# Patient Record
Sex: Male | Born: 1975 | Race: Black or African American | Hispanic: No | Smoking: Former smoker
Health system: Southern US, Community
[De-identification: ages and names within clinical notes are randomized; demographics above are authoritative.]

## PROBLEM LIST (undated history)

## (undated) DIAGNOSIS — R569 Unspecified convulsions: Secondary | ICD-10-CM

## (undated) DIAGNOSIS — F1910 Other psychoactive substance abuse, uncomplicated: Secondary | ICD-10-CM

## (undated) HISTORY — PX: NO PAST SURGERIES: SHX2092

---

## 2017-12-13 ENCOUNTER — Observation Stay (HOSPITAL_COMMUNITY): Payer: BLUE CROSS/BLUE SHIELD

## 2017-12-13 ENCOUNTER — Other Ambulatory Visit: Payer: Self-pay

## 2017-12-13 ENCOUNTER — Encounter (HOSPITAL_COMMUNITY): Payer: Self-pay | Admitting: Emergency Medicine

## 2017-12-13 ENCOUNTER — Emergency Department (HOSPITAL_COMMUNITY): Payer: BLUE CROSS/BLUE SHIELD

## 2017-12-13 ENCOUNTER — Inpatient Hospital Stay (HOSPITAL_COMMUNITY)
Admission: EM | Admit: 2017-12-13 | Discharge: 2017-12-17 | DRG: 918 | Disposition: A | Discharge status: Home / Self Care | Payer: BLUE CROSS/BLUE SHIELD | Attending: Student in an Organized Health Care Education/Training Program | Admitting: Student in an Organized Health Care Education/Training Program

## 2017-12-13 DIAGNOSIS — F1099 Alcohol use, unspecified with unspecified alcohol-induced disorder: Secondary | ICD-10-CM

## 2017-12-13 DIAGNOSIS — T407X1A Poisoning by cannabis (derivatives), accidental (unintentional), initial encounter: Secondary | ICD-10-CM | POA: Diagnosis not present

## 2017-12-13 DIAGNOSIS — Z87891 Personal history of nicotine dependence: Secondary | ICD-10-CM

## 2017-12-13 DIAGNOSIS — F439 Reaction to severe stress, unspecified: Secondary | ICD-10-CM | POA: Diagnosis present

## 2017-12-13 DIAGNOSIS — R7989 Other specified abnormal findings of blood chemistry: Secondary | ICD-10-CM | POA: Diagnosis not present

## 2017-12-13 DIAGNOSIS — R000 Tachycardia, unspecified: Secondary | ICD-10-CM | POA: Diagnosis present

## 2017-12-13 DIAGNOSIS — S01552A Open bite of oral cavity, initial encounter: Secondary | ICD-10-CM | POA: Diagnosis present

## 2017-12-13 DIAGNOSIS — E876 Hypokalemia: Secondary | ICD-10-CM | POA: Diagnosis not present

## 2017-12-13 DIAGNOSIS — F1290 Cannabis use, unspecified, uncomplicated: Secondary | ICD-10-CM | POA: Diagnosis not present

## 2017-12-13 DIAGNOSIS — R569 Unspecified convulsions: Secondary | ICD-10-CM

## 2017-12-13 DIAGNOSIS — R748 Abnormal levels of other serum enzymes: Secondary | ICD-10-CM | POA: Diagnosis present

## 2017-12-13 DIAGNOSIS — X58XXXA Exposure to other specified factors, initial encounter: Secondary | ICD-10-CM | POA: Diagnosis present

## 2017-12-13 DIAGNOSIS — R7309 Other abnormal glucose: Secondary | ICD-10-CM | POA: Diagnosis not present

## 2017-12-13 DIAGNOSIS — M6282 Rhabdomyolysis: Secondary | ICD-10-CM | POA: Diagnosis not present

## 2017-12-13 DIAGNOSIS — S01512A Laceration without foreign body of oral cavity, initial encounter: Secondary | ICD-10-CM | POA: Diagnosis not present

## 2017-12-13 DIAGNOSIS — R739 Hyperglycemia, unspecified: Secondary | ICD-10-CM | POA: Diagnosis present

## 2017-12-13 DIAGNOSIS — N179 Acute kidney failure, unspecified: Secondary | ICD-10-CM | POA: Diagnosis present

## 2017-12-13 HISTORY — DX: Unspecified convulsions: R56.9

## 2017-12-13 HISTORY — DX: Other psychoactive substance abuse, uncomplicated: F19.10

## 2017-12-13 LAB — URINALYSIS, ROUTINE W REFLEX MICROSCOPIC
BILIRUBIN URINE: NEGATIVE
Bacteria, UA: NONE SEEN
Glucose, UA: 150 mg/dL — AB
Ketones, ur: NEGATIVE mg/dL
Leukocytes, UA: NEGATIVE
Nitrite: NEGATIVE
PROTEIN: 30 mg/dL — AB
SQUAMOUS EPITHELIAL / LPF: NONE SEEN
Specific Gravity, Urine: 1.011 (ref 1.005–1.030)
pH: 6 (ref 5.0–8.0)

## 2017-12-13 LAB — COMPREHENSIVE METABOLIC PANEL
ALBUMIN: 4.2 g/dL (ref 3.5–5.0)
ALT: 32 U/L (ref 17–63)
ANION GAP: 16 — AB (ref 5–15)
AST: 51 U/L — AB (ref 15–41)
Alkaline Phosphatase: 76 U/L (ref 38–126)
BILIRUBIN TOTAL: 1 mg/dL (ref 0.3–1.2)
BUN: 8 mg/dL (ref 6–20)
CHLORIDE: 100 mmol/L — AB (ref 101–111)
CO2: 22 mmol/L (ref 22–32)
Calcium: 9.4 mg/dL (ref 8.9–10.3)
Creatinine, Ser: 1.3 mg/dL — ABNORMAL HIGH (ref 0.61–1.24)
GFR calc Af Amer: >60 mL/min (ref >60)
GFR calc non Af Amer: >60 mL/min (ref >60)
GLUCOSE: 214 mg/dL — AB (ref 65–99)
POTASSIUM: 2.7 mmol/L — AB (ref 3.5–5.1)
SODIUM: 138 mmol/L (ref 135–145)
TOTAL PROTEIN: 6.7 g/dL (ref 6.5–8.1)

## 2017-12-13 LAB — CBG MONITORING, ED: GLUCOSE-CAPILLARY: 122 mg/dL — AB (ref 65–99)

## 2017-12-13 LAB — CBC WITH DIFFERENTIAL/PLATELET
BASOS PCT: 0 %
Basophils Absolute: 0 10*3/uL (ref 0.0–0.1)
Eosinophils Absolute: 0 10*3/uL (ref 0.0–0.7)
Eosinophils Relative: 0 %
HEMATOCRIT: 40.1 % (ref 39.0–52.0)
Hemoglobin: 13.4 g/dL (ref 13.0–17.0)
LYMPHS PCT: 20 %
Lymphs Abs: 1.8 10*3/uL (ref 0.7–4.0)
MCH: 30.7 pg (ref 26.0–34.0)
MCHC: 33.4 g/dL (ref 30.0–36.0)
MCV: 92 fL (ref 78.0–100.0)
MONO ABS: 0.6 10*3/uL (ref 0.1–1.0)
MONOS PCT: 7 %
NEUTROS ABS: 6.7 10*3/uL (ref 1.7–7.7)
Neutrophils Relative %: 73 %
Platelets: 305 10*3/uL (ref 150–400)
RBC: 4.36 MIL/uL (ref 4.22–5.81)
RDW: 13.3 % (ref 11.5–15.5)
WBC: 9.2 10*3/uL (ref 4.0–10.5)

## 2017-12-13 LAB — RAPID URINE DRUG SCREEN, HOSP PERFORMED
Amphetamines: NOT DETECTED
BARBITURATES: NOT DETECTED
BENZODIAZEPINES: NOT DETECTED
COCAINE: NOT DETECTED
Opiates: NOT DETECTED
TETRAHYDROCANNABINOL: POSITIVE — AB

## 2017-12-13 LAB — BASIC METABOLIC PANEL
Anion gap: 9 (ref 5–15)
BUN: 6 mg/dL (ref 6–20)
CALCIUM: 8.8 mg/dL — AB (ref 8.9–10.3)
CO2: 22 mmol/L (ref 22–32)
CREATININE: 1.15 mg/dL (ref 0.61–1.24)
Chloride: 109 mmol/L (ref 101–111)
GFR calc Af Amer: >60 mL/min (ref >60)
GFR calc non Af Amer: >60 mL/min (ref >60)
Glucose, Bld: 89 mg/dL (ref 65–99)
Potassium: 3.9 mmol/L (ref 3.5–5.1)
Sodium: 140 mmol/L (ref 135–145)

## 2017-12-13 LAB — ETHANOL: Alcohol, Ethyl (B): <10 mg/dL (ref <10)

## 2017-12-13 LAB — MAGNESIUM: MAGNESIUM: 2.3 mg/dL (ref 1.7–2.4)

## 2017-12-13 LAB — I-STAT VENOUS BLOOD GAS, ED
Bicarbonate: 25.1 mmol/L (ref 20.0–28.0)
O2 Saturation: 69 %
TCO2: 26 mmol/L (ref 22–32)
pCO2, Ven: 42.6 mmHg — ABNORMAL LOW (ref 44.0–60.0)
pH, Ven: 7.378 (ref 7.250–7.430)
pO2, Ven: 37 mmHg (ref 32.0–45.0)

## 2017-12-13 LAB — CK
CK TOTAL: 11261 U/L — AB (ref 49–397)
CK TOTAL: 23980 U/L — AB (ref 49–397)

## 2017-12-13 LAB — I-STAT TROPONIN, ED: Troponin i, poc: 0 ng/mL (ref 0.00–0.08)

## 2017-12-13 MED ORDER — SODIUM CHLORIDE 0.9 % IV SOLN
INTRAVENOUS | Status: AC
  Administered 2017-12-13 – 2017-12-14 (×6): via INTRAVENOUS

## 2017-12-13 MED ORDER — SODIUM CHLORIDE 0.9% FLUSH
3.0000 mL | Freq: Two times a day (BID) | INTRAVENOUS | Status: DC
  Administered 2017-12-14: 3 mL via INTRAVENOUS

## 2017-12-13 MED ORDER — SODIUM CHLORIDE 0.9% FLUSH: 3.0000 mL | Freq: Two times a day (BID) | INTRAVENOUS | Status: DC

## 2017-12-13 MED ORDER — POTASSIUM CHLORIDE 10 MEQ/100ML IV SOLN
10.0000 meq | Freq: Once | INTRAVENOUS | Status: AC
  Administered 2017-12-13: 10 meq via INTRAVENOUS
  Filled 2017-12-13: qty 100

## 2017-12-13 MED ORDER — SODIUM CHLORIDE 0.9 % IV BOLUS (SEPSIS)
1000.0000 mL | Freq: Once | INTRAVENOUS | Status: AC
  Administered 2017-12-13: 1000 mL via INTRAVENOUS

## 2017-12-13 MED ORDER — CHLORDIAZEPOXIDE HCL 5 MG PO CAPS: 10.0000 mg | ORAL_CAPSULE | Freq: Three times a day (TID) | ORAL | Status: DC

## 2017-12-13 MED ORDER — SODIUM CHLORIDE 0.9% FLUSH: 3.0000 mL | INTRAVENOUS | Status: DC | PRN

## 2017-12-13 MED ORDER — ENOXAPARIN SODIUM 40 MG/0.4ML ~~LOC~~ SOLN
40.0000 mg | SUBCUTANEOUS | Status: DC
  Administered 2017-12-13 – 2017-12-16 (×4): 40 mg via SUBCUTANEOUS
  Filled 2017-12-13 (×4): qty 0.4

## 2017-12-13 MED ORDER — SODIUM CHLORIDE 0.9 % IV SOLN
1000.0000 mg | Freq: Once | INTRAVENOUS | Status: AC
  Administered 2017-12-13: 1000 mg via INTRAVENOUS
  Filled 2017-12-13: qty 10

## 2017-12-13 MED ORDER — SODIUM CHLORIDE 0.9 % IV SOLN
500.0000 mg | Freq: Two times a day (BID) | INTRAVENOUS | Status: DC
  Administered 2017-12-13 – 2017-12-14 (×2): 500 mg via INTRAVENOUS
  Filled 2017-12-13 (×2): qty 5

## 2017-12-13 MED ORDER — POTASSIUM CHLORIDE CRYS ER 20 MEQ PO TBCR
40.0000 meq | EXTENDED_RELEASE_TABLET | Freq: Once | ORAL | Status: AC
  Administered 2017-12-13: 40 meq via ORAL
  Filled 2017-12-13: qty 2

## 2017-12-13 MED ORDER — LORAZEPAM 2 MG/ML IJ SOLN
1.0000 mg | Freq: Once | INTRAMUSCULAR | Status: DC | PRN
  Filled 2017-12-13: qty 1

## 2017-12-13 MED ORDER — LACTATED RINGERS IV BOLUS (SEPSIS)
1000.0000 mL | Freq: Once | INTRAVENOUS | Status: AC
  Administered 2017-12-13: 1000 mL via INTRAVENOUS

## 2017-12-13 MED ORDER — LORAZEPAM 2 MG/ML IJ SOLN
1.0000 mg | Freq: Once | INTRAMUSCULAR | Status: AC
  Administered 2017-12-13: 1 mg via INTRAVENOUS
  Filled 2017-12-13: qty 1

## 2017-12-13 MED ORDER — ONDANSETRON HCL 4 MG/2ML IJ SOLN
4.0000 mg | Freq: Four times a day (QID) | INTRAMUSCULAR | Status: DC | PRN
  Administered 2017-12-13: 4 mg via INTRAVENOUS
  Filled 2017-12-13: qty 2

## 2017-12-13 MED ORDER — SODIUM CHLORIDE 0.9 % IV SOLN
250.0000 mL | INTRAVENOUS | Status: DC | PRN
  Administered 2017-12-14: 250 mL via INTRAVENOUS

## 2017-12-13 NOTE — ED Notes (Signed)
Patient remains in MRI. Neurology paged to request ativan

## 2017-12-13 NOTE — ED Provider Notes (Signed)
[facility]   CSN: [id number] Arrival date & time: 12/13/17  0449     History   Chief Complaint Chief Complaint  Patient presents with  . Seizures    HPI Lady SaucierQuincy Beza is a 42 y.o. male.  HPI 42 year old African-American male with no pertinent past medical history presents to the emergency department today with family at bedside for evaluation following a seizure-like activity.  The patient states that he was getting home from work this morning after working overnight and his family heard him in the kitchen.  Patient states that this is the last thing that he remembers.  Family reports that they got up and went to the kitchen as outpatient "convulsing on the floor".  They state that he was convulsing for approximately 15-20 minutes.  They state that he was groggy and altered afterwards.  EMS arrived where they evaluated patient and convince patient to come to the ED for evaluation.  Patient was postictal at EMS arrival at home.  The patient states that he thinks that he might of had one seizure approximately 20 years ago but has no other history of seizure take no seizure medication does not have a neurologist.  Patient has no medical complaints and is not taking medications regularly.  Patient denies any associated symptoms including headache, vision changes, lightheadedness, dizziness, neck pain, fevers, chills, chest pain, shortness of breath, abdominal pain, nausea, emesis, urinary symptoms, recent illnesses or surgeries.  Pt denies any fever, chill, ha, vision changes, lightheadedness, dizziness, congestion, neck pain, cp, sob, cough, abd pain, n/v/d, urinary symptoms, change in bowel habits, melena, hematochezia, lower extremity paresthesias.  History reviewed. No pertinent past medical history.  There are no active problems to display for this patient.   History reviewed. No pertinent surgical history.     Home  Medications    Prior to Admission medications   Not on File    Family History No family history on file.  Social History Social History   Tobacco Use  . Smoking status: Never Smoker  . Smokeless tobacco: Never Used  Substance Use Topics  . Alcohol use: Yes  . Drug use: Yes    Types: Marijuana     Allergies   Patient has no known allergies.   Review of Systems Review of Systems  Constitutional: Negative for chills and fever.  HENT: Negative for congestion and sore throat.   Eyes: Negative for visual disturbance.  Respiratory: Negative for cough and shortness of breath.   Cardiovascular: Negative for chest pain.  Gastrointestinal: Negative for abdominal pain, diarrhea, nausea and vomiting.  Genitourinary: Negative for dysuria, flank pain, frequency, hematuria, scrotal swelling, testicular pain and urgency.  Musculoskeletal: Negative for arthralgias, myalgias, neck pain and neck stiffness.  Skin: Negative for rash.  Neurological: Positive for seizures. Negative for dizziness, syncope, weakness, light-headedness, numbness and headaches.  Psychiatric/Behavioral: Negative for sleep disturbance. The patient is not nervous/anxious.      Physical Exam Updated Vital Signs BP (!) 155/92   Pulse 89   Temp 97.8 F (36.6 C) (Oral)   Resp (!) 22   Ht 6\' 2"  (1.88 m)   Wt 79.4 kg (175 lb)   SpO2 100%   BMI 22.47 kg/m   Physical Exam  Constitutional: He is oriented to person, place, and time. He appears well-developed and well-nourished.  Non-toxic appearance. No distress.  HENT:  Head: Normocephalic and atraumatic.  Mouth/Throat: Oropharynx is clear and moist.  No bilateral hemotympanum.  No septal hematoma. Patient does have small abrasion to the right side of his tongue where he did bite his tongue during his seizure-like activity.  Eyes: Conjunctivae and EOM are normal. Pupils are equal, round, and reactive to light. Right eye exhibits no discharge. Left eye exhibits  no discharge.  Neck: Normal range of motion. Neck supple.  No c spine midline tenderness. No paraspinal tenderness. No deformities or step offs noted. Full ROM. Supple. No nuchal rigidity.    Cardiovascular: Normal rate, regular rhythm, normal heart sounds and intact distal pulses. Exam reveals no gallop and no friction rub.  No murmur heard. Pulmonary/Chest: Effort normal and breath sounds normal. No stridor. No respiratory distress. He has no wheezes. He has no rales. He exhibits no tenderness.  Abdominal: Soft. Bowel sounds are normal. He exhibits no distension. There is no tenderness. There is no rebound and no guarding.  Musculoskeletal: Normal range of motion. He exhibits no tenderness.  No midline T spine or L spine tenderness. No deformities or step offs noted. Full ROM. Pelvis is stable.   Lymphadenopathy:    He has no cervical adenopathy.  Neurological: He is alert and oriented to person, place, and time.  The patient is alert, attentive, and oriented x 3. Speech is clear. Cranial nerve II-VII grossly intact. Negative pronator drift. Sensation intact. Strength 5/5 in all extremities. Reflexes 2+ and symmetric at biceps, triceps, knees, and ankles. Rapid alternating movement and fine finger movements intact.  Romberg and gait not assessed due to seizure protocol in place.   Skin: Skin is warm and dry. Capillary refill takes less than 2 seconds. No rash noted.  Psychiatric: His behavior is normal. Judgment and thought content normal.  Nursing note and vitals reviewed.    ED Treatments / Results  Labs (all labs ordered are listed, but only abnormal results are displayed) Labs Reviewed  COMPREHENSIVE METABOLIC PANEL - Abnormal; Notable for the following components:      Result Value   Potassium 2.7 (*)    Chloride 100 (*)    Glucose, Bld 214 (*)    Creatinine, Ser 1.30 (*)    AST 51 (*)    Anion gap 16 (*)    All other components within normal limits  ETHANOL  CBC WITH  DIFFERENTIAL/PLATELET  MAGNESIUM  RAPID URINE DRUG SCREEN, HOSP PERFORMED  URINALYSIS, ROUTINE W REFLEX MICROSCOPIC  I-STAT TROPONIN, ED    EKG  EKG Interpretation  Date/Time:  Tuesday December 13 2017 05:10:53 EST Ventricular Rate:  103 PR Interval:    QRS Duration: 93 QT Interval:  347 QTC Calculation: 455 R Axis:   109 Text Interpretation:  Sinus tachycardia Consider right atrial enlargement Right ventricular hypertrophy ST elevation suggests acute pericarditis No old tracing to compare Confirmed by Devoria Albe ([id number]) on 12/13/2017 5:12:45 AM       Radiology Ct Head Wo Contrast  Result Date: 12/13/2017 CLINICAL DATA:  Seizure EXAM: CT HEAD WITHOUT CONTRAST TECHNIQUE: Contiguous axial images were obtained from the base of the skull through the vertex without intravenous contrast. COMPARISON:  None. FINDINGS: Brain: No mass lesion, intraparenchymal hemorrhage or extra-axial collection. No evidence of acute cortical infarct. Brain parenchyma and CSF-containing spaces are normal for age. Vascular: No hyperdense vessel or unexpected calcification. Skull: Normal visualized skull base, calvarium and extracranial soft tissues. Sinuses/Orbits: No sinus fluid levels or advanced mucosal thickening. No mastoid effusion. Normal orbits. IMPRESSION: Normal head CT. Electronically Signed   By: Deatra Robinson M.D.   On:  12/13/2017 06:13    Procedures Procedures (including critical care time)  Medications Ordered in ED Medications  lactated ringers bolus 1,000 mL (1,000 mLs Intravenous New Bag/Given 12/13/17 0704)  potassium chloride SA (K-DUR,KLOR-CON) CR tablet 40 mEq (not administered)  potassium chloride 10 mEq in 100 mL IVPB (10 mEq Intravenous New Bag/Given 12/13/17 0709)     Initial Impression / Assessment and Plan / ED Course  I have reviewed the triage vital signs and the nursing notes.  Pertinent labs & imaging results that were available during my care of the patient were reviewed by  me and considered in my medical decision making (see chart for details).  Clinical Course as of Dec 14 1123  Tue Dec 13, 2017  2440 At 850 was notified by the nursing staff that patient had an additional seizure in the room.  He was incontinent of urine.  Presumably patient did bite his tongue because he was bleeding from his mouth however have not been able to evaluate given patient's postictal state and not being very cooperative today.  Patient's vital signs are reassuring at this time.  He is diaphoretic in the room.  Keppra load was ordered.  Spoke with Dr. Elon Spanner with neurology who recommends holding Ativan will see patient in consultation with hospital admission.  Patient will need MRI, EEG and seizure workup.  [KL]    Clinical Course User Index [KL] Rise Mu, PA-C   Patient presents to the ED for evaluation of seizure-like activity at home.  Patient has had one prior seizure 20 years ago due to drugs however no history of seizures and does not see neurology regularly.  Patient takes no medications regularly.  Exam patient is overall well-appearing and postictal at this time.  He is conscious alert and oriented x3 and can follows commands appropriately.  No signs of intracranial, intrathoracic, intra-abdominal trauma.  Patient is afebrile.  Heart regular rate and rhythm.  Lungs clear to auscultation bilaterally.  No focal abdominal tenderness.  No nuchal rigidity is noted.  No focal neuro deficit noted on exam.  Initial lab work and imaging was started in triage.  Patient had normal CT scan.  Lab work does reveal hypokalemia of 2.7 which was replaced orally with IV potassium.  No leukocytosis was noted.  The patient's magnesium and calcium levels are unremarkable.  Normal sodium.  Patient's creatinine is mildly elevated at 1.3 baseline appears to be 1-1.1.  He does have a mild anion gap of 16.  Mild elevation in AST of 51 with no history of same.  UA does not show any signs of  infection.  No ketones noted in the urine.  Patient's pH is normal.  Bicarb is normal.  Ethanol less than 10.  Troponin was negative.  UDS does show positive marijuana.  CK level was drawn that was 11,000.  Patient appears back at baseline per his family.  Given that this was patient's first seizure likely will need neurology outpatient follow-up.  After patient was observed for approximately 4 hours the nurse notified me that patient had [place] additional seizure.  On examination patient was noted to be incontinent.  He was bleeding from his mouth and noted to have a laceration to the left side of his tongue. This was not open enough to warrant repair. Patient was postictal at this time.  Vital signs are reassuring.  Patient was diaphoretic.  Iv keppra was ordered. Hold on the Ativan given that pt was postictal.  Spoke with Dr.  Aroora with neurology who recommended hospital admission and will order patient MRI, EEG.  Head no no other additional issues at this time.  I did speak with internal medicine teaching service who agrees to admission will see patient in the ED and place admission orders.  I did reevaluate patient who was more alert and oriented.  Follows commands appropriately.  Family was updated on plan of care.  Patient verbalized understanding with plan.  Remains hemodynamically stable this time.  Patient was being seen by neurology.  Awaiting room assignment and orders at this time.  Final Clinical Impressions(s) / ED Diagnoses   Final diagnoses:  None    ED Discharge Orders    None       Wallace Keller 12/13/17 1608    Devoria Albe, MD 12/14/17 [phone]

## 2017-12-13 NOTE — Progress Notes (Signed)
EEG completed, results pending. 

## 2017-12-13 NOTE — ED Notes (Signed)
Patient transported to MRI 

## 2017-12-13 NOTE — ED Notes (Addendum)
Patient placed on a monitor/pulse oximetry , saline lock intact , side rails padded , alert and oriented , respirations unlabored /denies pain .

## 2017-12-13 NOTE — ED Triage Notes (Signed)
Patient arrived with EMS from home , found on the kitchen floor seizing this morning approx. 5 mins , post ictal at EMS arrival at home , pt. can not recall incident , alert and oriented at arrival , denies pain/respirations unlabored , CBG=173 by EMS .

## 2017-12-13 NOTE — Consult Note (Signed)
NEURO HOSPITALIST CONSULT NOTE   Requestig physician: Dr. Lynelle Doctor  Reason for Consult: Seizure  History obtained from:  Patient     HPI:                                                                                                                                          Reginald Ray is an 42 y.o. male no significant past medical history other than a seizure back in 1997.  Talking to the mother she states that she believes it was due to substance abuse.  Patient works at [employer] and apparently today had a seizure.  He was not driving but feeling packages.  Patient was brought to the emergency department where he had a second seizure to which he bit his tongue and has a large laceration on the right side of his tongue.  Currently he is slightly postictal but able to give a good history.  Mother walked into the room and stated he was a normal birth, no head trauma in the past, no febrile seizures in the past, no seizure history in the family, and no sickle cell trait.  His.  He does admit to smoking marijuana last night.  States he does use marijuana from a Herbalist.  States he drinks 1 beer a night.  He is being loaded with 1 g of Keppra.  Past Medical History:  Diagnosis Date  . Seizure (HCC)   . Substance abuse (HCC)     History reviewed. No pertinent surgical history.  Family History  Problem Relation Age of Onset  . Hypertension Mother   . Hypertension Father    Social History:  reports that  has never smoked. he has never used smokeless tobacco. He reports that he drinks about 0.6 oz of alcohol per week. He reports that he uses drugs. Drug: Marijuana.  No Known Allergies  MEDICATIONS:                                                                                                                     Current Facility-Administered Medications  Medication Dose Route Frequency Provider Last Rate Last Dose  . LORazepam (ATIVAN) injection 1 mg  1 mg Intravenous  Once PRN Jacalyn Lefevre, MD       Current Outpatient  Medications  Medication Sig Dispense Refill  . acetaminophen (TYLENOL) 500 MG tablet Take 1,000 mg by mouth every 6 (six) hours as needed for mild pain.     ROS:                                                                                                                                       History obtained from the patient  General ROS: negative for - chills, fatigue, fever, night sweats, weight gain or weight loss Psychological ROS: negative for - behavioral disorder, hallucinations, memory difficulties, mood swings or suicidal ideation Ophthalmic ROS: negative for - blurry vision, double vision, eye pain or loss of vision ENT ROS: negative for - epistaxis, nasal discharge, oral lesions, sore throat, tinnitus or vertigo Allergy and Immunology ROS: negative for - hives or itchy/watery eyes Hematological and Lymphatic ROS: negative for - bleeding problems, bruising or swollen lymph nodes Endocrine ROS: negative for - galactorrhea, hair pattern changes, polydipsia/polyuria or temperature intolerance Respiratory ROS: negative for - cough, hemoptysis, shortness of breath or wheezing Cardiovascular ROS: negative for - chest pain, dyspnea on exertion, edema or irregular heartbeat Gastrointestinal ROS: negative for - abdominal pain, diarrhea, hematemesis, nausea/vomiting or stool incontinence Genito-Urinary ROS: negative for - dysuria, hematuria, incontinence or urinary frequency/urgency Musculoskeletal ROS: negative for - joint swelling or muscular weakness Neurological ROS: as noted in HPI Dermatological ROS: negative for rash and skin lesion changes   Blood pressure (!) 142/78, pulse 93, temperature 98.5 F (36.9 C), temperature source Oral, resp. rate 17, height 6\' 2"  (1.88 m), weight 79.4 kg (175 lb), SpO2 100 %.  General Examination:                                                                                                        Physical Exam  HEENT-  Normocephalic, no lesions, without obvious abnormality.  Normal external eye and conjunctiva.  Laceration to the right side of his tongue Cardiovascular- S1-S2 audible, pulses palpable throughout   Lungs-no rhonchi or wheezing noted, no excessive working breathing.  Saturations within normal limits Abdomen- All 4 quadrants palpated and nontender Extremities- Warm, dry and intact Musculoskeletal-no joint tenderness, deformity or swelling Skin-warm and dry, no hyperpigmentation, vitiligo, or suspicious lesions  Neurological Examination Mental Status: Alert, oriented, thought content appropriate.  Speech fluent without evidence of aphasia.  Able to follow 3 step commands without difficulty. Cranial Nerves: II: Visual fields grossly normal,  III,IV, VI: ptosis not present, extra-ocular motions intact  bilaterally pupils equal, round, reactive to light and accommodation V,VII: smile symmetric, facial light touch sensation normal bilaterally VIII: hearing normal bilaterally IX,X: uvula rises symmetrically XI: bilateral shoulder shrug XII: midline tongue extension Motor: Right : Upper extremity   5/5    Left:     Upper extremity   5/5  Lower extremity   5/5     Lower extremity   5/5 Tone and bulk:normal tone throughout; no atrophy noted Sensory: Pinprick and light touch intact throughout, bilaterally Deep Tendon Reflexes: 2+ and symmetric throughout Plantars: Right: downgoing   Left: downgoing Cerebellar: normal finger-to-nose,  and normal heel-to-shin test    Lab Results: Basic Metabolic Panel: Recent Labs  Lab 12/13/17 0530  NA 138  K 2.7*  CL 100*  CO2 22  GLUCOSE 214*  BUN 8  CREATININE 1.30*  CALCIUM 9.4  MG 2.3    Liver Function Tests: Recent Labs  Lab 12/13/17 0530  AST 51*  ALT 32  ALKPHOS 76  BILITOT 1.0  PROT 6.7  ALBUMIN 4.2   No results for input(s): LIPASE, AMYLASE in the last 168 hours. No results for input(s): AMMONIA in  the last 168 hours.  CBC: Recent Labs  Lab 12/13/17 0530  WBC 9.2  NEUTROABS 6.7  HGB 13.4  HCT 40.1  MCV 92.0  PLT 305    Cardiac Enzymes: Recent Labs  Lab 12/13/17 0808  CKTOTAL 11,261*    Lipid Panel: No results for input(s): CHOL, TRIG, HDL, CHOLHDL, VLDL, LDLCALC in the last 168 hours.  CBG: Recent Labs  Lab 12/13/17 0901  GLUCAP 122*    Microbiology: No results found for this or any previous visit.  Coagulation Studies: No results for input(s): LABPROT, INR in the last 72 hours.  Imaging: Ct Head Wo Contrast  Result Date: 12/13/2017 CLINICAL DATA:  Seizure EXAM: CT HEAD WITHOUT CONTRAST TECHNIQUE: Contiguous axial images were obtained from the base of the skull through the vertex without intravenous contrast. COMPARISON:  None. FINDINGS: Brain: No mass lesion, intraparenchymal hemorrhage or extra-axial collection. No evidence of acute cortical infarct. Brain parenchyma and CSF-containing spaces are normal for age. Vascular: No hyperdense vessel or unexpected calcification. Skull: Normal visualized skull base, calvarium and extracranial soft tissues. Sinuses/Orbits: No sinus fluid levels or advanced mucosal thickening. No mastoid effusion. Normal orbits. IMPRESSION: Normal head CT. Electronically Signed   By: Deatra Robinson M.D.   On: 12/13/2017 06:13   Assessment and plan per attending neurologist  Felicie Morn PA-C [facility] [phone]  12/13/2017, 9:38 AM   Assessment/Plan: 42 year old male with known marijuana use presenting with breakthrough seizure x2.  Currently he is slightly postictal.  He is receiving a 1 g of Keppra x1 now and will receive 500 mg twice daily. Needs further imaging to r/o structural lesion. CTH reviewed by me and is unremarkable for acute change or bleed.   Impression --Seizures - h/o one seizure in 1997 and today had 2 seizures with complete return to baseline between them. No suspicion for status epilepticus based  on exam. --Marijuana use  Recommend: -Keppra 500 mg twice daily -Seizure precautions -routine EEG -MRI with and without contrast of brain -Will need to follow-up with neurologist as an outpatient  -discussed importance of abstaining from illicit drugs  Per [facility] statutes, patients with seizures are not allowed to drive until they have been seizure-free for six months.   Use caution when using heavy equipment or power tools. Avoid working on ladders or at heights. Take  showers instead of baths. Ensure the water temperature is not too high on the home water heater. Do not go swimming alone. Do not lock yourself in a room alone (i.e. bathroom). When caring for infants or small children, sit down when holding, feeding, or changing them to minimize risk of injury to the child in the event you have a seizure. Maintain good sleep hygiene. Avoid alcohol.   If patient has another seizure, call 911 and bring them back to the ED if: A. The seizure lasts longer than 5 minutes.  B. The patient doesn't wake shortly after the seizure or has new problems such as difficulty seeing, speaking or moving following the seizure C. The patient was injured during the seizure D. The patient has a temperature over 102 F (39C) E. The patient vomited during the seizure and now is having trouble breathing   Attending Neurohospitalist Addendum Patient seen and examined with APP/Resident. Agree with the history and physical as documented above. Agree with the plan as documented, which I helped formulate. I have independently reviewed the chart, obtained history, review of systems and examined the patient.I have personally reviewed pertinent head/neck/spine imaging (CT/MRI). Please feel free to call with any questions.  I have discussed seizure precautions and driving restriction indetail with patient and his mother who was at bedside. --- Milon DikesAshish Johnette Teigen, MD [facility] Pager:  [phone]  If 7pm to 7am, please call on call as listed on AMION.

## 2017-12-13 NOTE — ED Notes (Signed)
Patient's monitor alarming. When checking on patient, patient found to be covered in blood confused, incontinent and not following commands. EDP aware and at bedside. Will continue to monitor

## 2017-12-13 NOTE — ED Notes (Signed)
Patient transported to CT scan . 

## 2017-12-13 NOTE — ED Notes (Signed)
Neuro at bedside.

## 2017-12-13 NOTE — Procedures (Signed)
HPI:  42 y/o with seizure  TECHNICAL SUMMARY:  A multichannel referential and bipolar montage EEG using the standard international 10-20 system was performed on the patient described as awake and drowsy.  The dominant background activity consists of 10-11 hertz activity seen most prominantly over the posterior head region.  The backgound activity is reactive to eye opening and closing procedures.  Low voltage fast (beta) activity is distributed symmetrically and maximally over the anterior head regions.  ACTIVATION:  Stepwise photic stimulation and HV were not performed  EPILEPTIFORM ACTIVITY:  There were no spikes, sharp waves or paroxysmal activity.  SLEEP: Stage I and a very brief stage II sleep architecture were noted.   IMPRESSION:  This is a normal EEG for the patients stated age.  There were no focal, hemispheric or lateralizing features.  No epileptiform activity was recorded.  A normal EEG does not exclude the diagnosis of a seizure disorder and if seizure remains high on the list of differential diagnosis, an ambulatory EEG may be of value.  Clinical correlation is required.

## 2017-12-13 NOTE — ED Notes (Signed)
Urinal given to patient.

## 2017-12-13 NOTE — ED Notes (Signed)
Pharmacy notified that keppra is needed stat
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
## 2017-12-14 DIAGNOSIS — F1290 Cannabis use, unspecified, uncomplicated: Secondary | ICD-10-CM

## 2017-12-14 DIAGNOSIS — R569 Unspecified convulsions: Secondary | ICD-10-CM | POA: Diagnosis present

## 2017-12-14 DIAGNOSIS — S01512D Laceration without foreign body of oral cavity, subsequent encounter: Secondary | ICD-10-CM | POA: Diagnosis not present

## 2017-12-14 DIAGNOSIS — X58XXXD Exposure to other specified factors, subsequent encounter: Secondary | ICD-10-CM

## 2017-12-14 DIAGNOSIS — R7309 Other abnormal glucose: Secondary | ICD-10-CM

## 2017-12-14 DIAGNOSIS — M6282 Rhabdomyolysis: Secondary | ICD-10-CM

## 2017-12-14 DIAGNOSIS — R7989 Other specified abnormal findings of blood chemistry: Secondary | ICD-10-CM | POA: Diagnosis not present

## 2017-12-14 DIAGNOSIS — R000 Tachycardia, unspecified: Secondary | ICD-10-CM | POA: Diagnosis present

## 2017-12-14 DIAGNOSIS — T407X1A Poisoning by cannabis (derivatives), accidental (unintentional), initial encounter: Secondary | ICD-10-CM | POA: Diagnosis present

## 2017-12-14 DIAGNOSIS — N179 Acute kidney failure, unspecified: Secondary | ICD-10-CM | POA: Diagnosis present

## 2017-12-14 DIAGNOSIS — R748 Abnormal levels of other serum enzymes: Secondary | ICD-10-CM | POA: Diagnosis present

## 2017-12-14 DIAGNOSIS — E876 Hypokalemia: Secondary | ICD-10-CM

## 2017-12-14 DIAGNOSIS — X58XXXA Exposure to other specified factors, initial encounter: Secondary | ICD-10-CM | POA: Diagnosis present

## 2017-12-14 DIAGNOSIS — Z87891 Personal history of nicotine dependence: Secondary | ICD-10-CM | POA: Diagnosis not present

## 2017-12-14 DIAGNOSIS — S01552A Open bite of oral cavity, initial encounter: Secondary | ICD-10-CM | POA: Diagnosis present

## 2017-12-14 DIAGNOSIS — F439 Reaction to severe stress, unspecified: Secondary | ICD-10-CM | POA: Diagnosis present

## 2017-12-14 DIAGNOSIS — R739 Hyperglycemia, unspecified: Secondary | ICD-10-CM | POA: Diagnosis present

## 2017-12-14 LAB — BASIC METABOLIC PANEL
ANION GAP: 13 (ref 5–15)
BUN: 5 mg/dL — AB (ref 6–20)
CHLORIDE: 110 mmol/L (ref 101–111)
CO2: 17 mmol/L — ABNORMAL LOW (ref 22–32)
Calcium: 8.4 mg/dL — ABNORMAL LOW (ref 8.9–10.3)
Creatinine, Ser: 1.14 mg/dL (ref 0.61–1.24)
GFR calc Af Amer: >60 mL/min (ref >60)
GFR calc non Af Amer: >60 mL/min (ref >60)
GLUCOSE: 79 mg/dL (ref 65–99)
POTASSIUM: 3.5 mmol/L (ref 3.5–5.1)
Sodium: 140 mmol/L (ref 135–145)

## 2017-12-14 LAB — HIV ANTIBODY (ROUTINE TESTING W REFLEX): HIV Screen 4th Generation wRfx: NONREACTIVE

## 2017-12-14 LAB — PHOSPHORUS: Phosphorus: 2.3 mg/dL — ABNORMAL LOW (ref 2.5–4.6)

## 2017-12-14 LAB — MAGNESIUM: Magnesium: 1.8 mg/dL (ref 1.7–2.4)

## 2017-12-14 LAB — CK: Total CK: 24920 U/L — ABNORMAL HIGH (ref 49–397)

## 2017-12-14 MED ORDER — LEVETIRACETAM 500 MG PO TABS
500.0000 mg | ORAL_TABLET | Freq: Two times a day (BID) | ORAL | Status: DC
  Administered 2017-12-15 – 2017-12-17 (×5): 500 mg via ORAL
  Filled 2017-12-14 (×5): qty 1

## 2017-12-14 MED ORDER — SODIUM CHLORIDE 0.9 % IV SOLN
INTRAVENOUS | Status: DC
  Administered 2017-12-14 – 2017-12-15 (×9): via INTRAVENOUS

## 2017-12-14 MED ORDER — LEVETIRACETAM 500 MG PO TABS
500.0000 mg | ORAL_TABLET | Freq: Once | ORAL | Status: AC
  Administered 2017-12-14: 500 mg via ORAL
  Filled 2017-12-14 (×2): qty 1

## 2017-12-14 NOTE — Progress Notes (Addendum)
Neurology progress note.  Subjective: No further seizures overnight, patient wants to leave adamantly  Exam: Vitals:   12/14/17 0510 12/14/17 0904  BP: (!) 137/53 (!) 124/57  Pulse: 70 65  Resp: 20 19  Temp: 99.3 F (37.4 C) 98.8 F (37.1 C)  SpO2: 100% 100%    Physical Exam  HEENT-  Normocephalic, no lesions, without obvious abnormality.  Normal external eye and conjunctiva.   Cardiovascular- S1-S2 audible, pulses palpable throughout   Lungs-no rhonchi or wheezing noted, no excessive working breathing.  Saturations within normal limits Abdomen- All 4 quadrants palpated and nontender Extremities- Warm, dry and intact Musculoskeletal-no joint tenderness, deformity or swelling Skin-warm and dry, no hyperpigmentation, vitiligo, or suspicious lesions Neuro:  Mental Status: Alert, oriented, thought content appropriate.  Speech fluent without evidence of aphasia.  Able to follow 3 step commands without difficulty. Cranial Nerves: II: Discs flat bilaterally; Visual fields grossly normal,  III,IV, VI: ptosis not present, extra-ocular motions intact bilaterally pupils equal, round, reactive to light and accommodation V,VII: smile symmetric, facial light touch sensation normal bilaterally VIII: hearing normal bilaterally IX,X: uvula rises symmetrically XI: bilateral shoulder shrug XII: midline tongue extension Motor: Right : Upper extremity   5/5    Left:     Upper extremity   5/5  Lower extremity   5/5     Lower extremity   5/5 Tone and bulk:normal tone throughout; no atrophy noted Sensory: Pinprick and light touch intact throughout, bilaterally Deep Tendon Reflexes: 2+ and symmetric throughout Plantars: Right: downgoing   Left: downgoing Cerebellar: normal finger-to-nose, normal rapid alternating movements and normal heel-to-shin test Gait: normal gait and station   Medications:  Scheduled: . enoxaparin (LOVENOX) injection  40 mg Subcutaneous Q24H  . sodium chloride flush   3 mL Intravenous Q12H  . sodium chloride flush  3 mL Intravenous Q12H   Continuous: . sodium chloride 250 mL (12/14/17 1029)  . levETIRAcetam 500 mg (12/14/17 1022)   ZOX:WRUEAV chloride, LORazepam, ondansetron (ZOFRAN) IV, sodium chloride flush  Pertinent Labs/Diagnostics: EEG results: IMPRESSION:  This is a normal EEG for the patients stated age.  There were no focal, hemispheric or lateralizing features.  No epileptiform activity was recorded.  A normal EEG does not exclude the diagnosis of a seizure disorder and if seizure remains high on the list of differential diagnosis, an ambulatory EEG may be of value.  Clinical correlation is required.  Patient CK total was 11,261 on arrival and has increased to 23,980--to be addressed by primary team Ct Head Wo Contrast Result Date: 12/13/2017  IMPRESSION: Normal head CT. Electronically Signed   By: Deatra Robinson M.D.   On: 12/13/2017 06:13   Mr Brain Wo Contrast Result Date: 12/13/2017 IMPRESSION: 1. Limited 4 sequence noncontrast MRI, patient was unable to tolerate further imaging. 2. Normal noncontrast MRI of the head. Electronically Signed   By: Awilda Metro M.D.   On: 12/13/2017 14:48  -- Felicie Morn PA-C [facility] [phone]  Attending addendum Agree with the history and physical documented above. I have independently examined the patient, reviewed the chart including imaging. MRI was incomplete with only 4 sequences acquired-no acute changes or structural lesions to explain the seizures. Laboratory findings reveal elevated CK which is up trending.  Impression:  42 year old male with 2 back-to-back seizures, one at work and then 1 while in the hospital.  No further seizures post receiving Keppra.  Noncontrast MRI of the head showed no intracranial abnormalities.  EEG did not show any epileptiform  activity.  Oddly enough, patient CK is been trending up since admission initially 11,261 and now 23,980--etiology  unclear.  Recommendations: -continue Keppra at current dose -Check Mg and Phos and replete if low. -patient will need to follow-up with neurology as an outpatient, ([facility] or [facility] - first available) -follow-up on CK total which is trending up with no clear etiology -continue seizure precautions -seizure precautions discussed in detail with the patient and the family at bedside including driving restrictions as below. -[place], patients with seizures are not allowed to drive until  they have been seizure-free for six months. Use caution when using heavy equipment or power tools. Avoid working on ladders or at heights. Take showers instead of baths. Ensure the water temperature is not too high on the home water heater. Do not go swimming alone. When caring for infants or small children, sit down when holding, feeding, or changing them to minimize risk of injury to the child in the event you have a seizure.  -Also, Maintain good sleep hygiene. Avoid alcohol and illicit drug use including marijuana.  --> Call 911 and bring the patient back to the ED if:             A.  The seizure lasts longer than 5 minutes.                  B.  The patient doesn't awaken shortly after the seizure             C.  The patient has new problems such as difficulty seeing, speaking or moving             D.  The patient was injured during the seizure             E.  The patient has a temperature over 102 F (39C)             F.  The patient vomited and now is having trouble breathing  -- Milon DikesAshish Terrace Chiem, MD [facility] Pager: [phone] If 7pm to 7am, please call on call as listed on AMION.

## 2017-12-14 NOTE — Plan of Care (Signed)
  Education: Knowledge of General Education information will improve 12/14/2017 0549 - Progressing by Olena Materobinson, Akansha Wyche G, RN Note POC reviewed with pt.

## 2017-12-14 NOTE — Progress Notes (Signed)
Subjective:  No acute events overnight.  Patient reports feeling well this morning and has no acute complaints.  Explained to patient the etiology for his seizures remains unclear at this time but could be secondary to marijuana use per neurology. Patient disagrees with this. States he did not smoked marijuana on day of presentation. He does report drinking 1 red bull everyday before going to work. Also explained to patient he will have to avoid driving and swimming for the next 6 months.  He is very resistant and states he needs to drive in order to go to work explained to patient his safety and the safety of others is at risk.  If he develops a seizure while driving.  Patient voiced understanding but continues to be opposed to it.  Also discussed elevated CK in the setting of recent seizure activity.  Explained to patient will continue to monitor his renal function as well as IV hydration.  Patient voiced understanding and is in agreement with plan.  All questions answered.   Objective:  Vital signs in last 24 hours: Vitals:   12/13/17 1839 12/13/17 2121 12/14/17 0026 12/14/17 0510  BP: 139/77 (!) 154/89 137/79 (!) 137/53  Pulse: 92 (!) 112 93 70  Resp: 16 18 18 20   Temp: 99.5 F (37.5 C) 98.3 F (36.8 C) 98.1 F (36.7 C) 99.3 F (37.4 C)  TempSrc: Oral Oral Oral Oral  SpO2: 100% 100% 100% 100%  Weight:      Height:       General: healthy-appearing male, well-nourished, well-developed, sleeping in bed in no acute distress  HENT: NCAT, neck supple and FROM, OP clear without exudates or erythema. There are 2 lacerations noted on L and R side of the tongue Eyes: anicteric sclera Cardiac: regular rate and rhythm, nl S1/S2, no murmurs, rubs or gallops  Pulm: CTAB, no wheezes or crackles, no increased work of breathing  Abd: soft, NTND, bowel sounds present Neuro: A&Ox3, able to move all 4 extremities spontaneously and purposely, no focal deficits noted Ext: warm and well perfused, no  peripheral edema   Assessment/Plan:  Principal Problem:   Seizure (HCC) Active Problems:   Rhabdomyolysis   Hypokalemia  # Seizures: Patient has 2 witnessed seizures on day of presentation. He has a history of seizure in 1997 thought to be secondary drug use. Not on AEDs at home. Neurology consulted who started patient on IV Keppra. MRI brain negative but limited study as patient unable to tolerate it. EEG negative though this does not rule out seizure activity in the brain. His mental status remains at baseline on exam and he has not had seizure activity since admission. Etiology of seizures remains unclear. Could be secondary to mariajuana use, per neurology but patient denies marijuana use on day of event.   - Neurology following, appreciate recommendations  - Seizure precautions  - On IV Keppra 500 mg BID  - Ativan 1mg  PRN for 1 dose  - Will follow up CK from this AM. Will consider repeating one later today. If we repeat it and CK<6K can be discharged home today if no further recommendations from neurology.   # Rhabdomyolysis: Likely secondary to seizure activity. UA in the ED noted to have large Hgb without RBCs. CK elevated at 11,000. Cr 1.30 but unknown baseline as patient without previous records on file. Repeat CK yesterday afternoon 23K and CK this morning is pending. Will follow up. His renal function is normal (unknown baseline) and he is having adequate  urine output. Will continue to monitor CK and renal function as well as continue IVF hydration as he continues to be at high risk for developing renal injury given markedly elevated CK.  - F/u CK levels  - Monitoring renal function  - NS @ 500 cc/hr  - Strict I/OS  # Hypokalemia: K 2.7 on arriva  S/p 40 mEq PO and 10 mEq IV in the ED with resolution ok hypokalemia. K 3.9 on repeat BMP. EKG showed sinus tachycardia without any peaked T waves or other changes.  - Will monitor closely on telemetry - Monitoring in setting of  rhabdomyolysis  # Elevated serum glucose: Likely stress reaction from seizure. No known history of DM. Resolved in repeat BMP yesterday.  - Monitoring   # Elevated serum Creatinine: Cr 1.3 on admission . No prior labs. AKI vs CKD.  This is now resolved and Cr 1.1 yesterday.  Lab work today is pending. We will continue to monitor renal function in the setting of rhabdomyolysis.  - F/u BMP    DVT prophylaxis: SQ Lovenox  Code Status: Full   Dispo: Anticipated discharge in approximately 1-2 day(s) pending improvement in CK.   Burna Cash, MD 12/14/2017, 6:19 AM Pager: [phone]

## 2017-12-15 LAB — BASIC METABOLIC PANEL
Anion gap: 8 (ref 5–15)
BUN: 5 mg/dL — ABNORMAL LOW (ref 6–20)
CALCIUM: 8.6 mg/dL — AB (ref 8.9–10.3)
CO2: 23 mmol/L (ref 22–32)
Chloride: 110 mmol/L (ref 101–111)
Creatinine, Ser: 0.94 mg/dL (ref 0.61–1.24)
GFR calc non Af Amer: >60 mL/min (ref >60)
Glucose, Bld: 79 mg/dL (ref 65–99)
Potassium: 3.3 mmol/L — ABNORMAL LOW (ref 3.5–5.1)
SODIUM: 141 mmol/L (ref 135–145)

## 2017-12-15 LAB — CK: CK TOTAL: 18750 U/L — AB (ref 49–397)

## 2017-12-15 MED ORDER — POTASSIUM CHLORIDE CRYS ER 20 MEQ PO TBCR: 40.0000 meq | EXTENDED_RELEASE_TABLET | Freq: Two times a day (BID) | ORAL | Status: DC

## 2017-12-15 MED ORDER — SODIUM CHLORIDE 0.9 % IV SOLN
INTRAVENOUS | Status: DC
Start: 2017-12-15 — End: 2017-12-17
  Administered 2017-12-15 – 2017-12-16 (×8): via INTRAVENOUS
  Administered 2017-12-17: 1000 mL via INTRAVENOUS

## 2017-12-15 MED ORDER — POTASSIUM CHLORIDE CRYS ER 20 MEQ PO TBCR
40.0000 meq | EXTENDED_RELEASE_TABLET | Freq: Once | ORAL | Status: AC
  Administered 2017-12-15: 40 meq via ORAL
  Filled 2017-12-15: qty 2

## 2017-12-15 NOTE — Progress Notes (Signed)
   Subjective:  No acute events overnight. Patient reports feeling well this morning and denies myalgias. He does not have any complaints this AM. Explained to patient need for ongoing IVF hydration in the setting of elevated CK. Advised to ambulate in the floor. Patient voiced understanding and is in agreement with plan. All questions answered.   Objective:  Vital signs in last 24 hours: Vitals:   12/14/17 1735 12/14/17 2200 12/15/17 0200 12/15/17 0520  BP: 138/73 139/78 (!) 144/74 (!) 158/76  Pulse: 63 (!) 59 (!) 59 (!) 59  Resp: 20 18 18 18   Temp: 98.6 F (37 C) 99.2 F (37.3 C) 98.6 F (37 C) 99 F (37.2 C)  TempSrc: Oral Oral Oral Oral  SpO2: 100% 100% 100% 99%  Weight:      Height:       General: young male, well-nourished, well-developed, in bed in no acute distress  Cardiac: regular rate and rhythm, nl S1/S2, no murmurs, rubs or gallops  Pulm: CTAB, no wheezes or crackles, no increased work of breathing  Abd: soft, NTND, normoactive bowel sounds Neuro: A&Ox3, no focal deficits noted  Ext: warm and well perfused, no peripheral edema   Assessment/Plan:  Principal Problem:   Seizure (HCC) Active Problems:   Rhabdomyolysis   Hypokalemia   # Seizures: Patient has 2 witnessed seizures on day of presentation. He has a history of seizure in 1997 thought to be secondary drug use. Not on AEDs at home. Neurology consulted who started patient on IV Keppra. MRI brain negative but limited study as patient unable to tolerate it. EEG negative though this does not rule out seizure activity in the brain. Transitioned to PO Keppra 1/23. His mental status remains at baseline on exam and he has not had seizure activity since admission. Etiology of seizures remains unclear. Could be secondary to mariajuana use, per neurology but patient denies marijuana use on day of event.   - Neurology following, appreciate recommendations  - Seizure precautions  - Continue PO Keppra 500 mg BID -  Ativan 1mg  PRN for 1 dose  - Will need outpatient neurology referral   # Rhabdomyolysis: Likely secondary to seizure activity. CK 11K--> 23K--> 24K --> 18K. His renal function remains normal and UOP 7.8L in the last 24 hours. Discuss with patient CK should be <5K before safely discharging him to minimize risk of kidney injury.  - Monitoring renal function  - IVF with NS @ 500--> 250cc/hr  - BMP and CK in AM  - Strict I/OS - Ambulate patient   # Hypokalemia: K 3.3 this morning s/p Kdur 40 mEq x1 - Repleting as needed  - Will monitor closely on telemetry - Monitoring in setting of rhabdomyolysis  # Elevated serum Creatinine: Resolved. Renal function remains stable.   # Elevated serum glucose: Likely stress reaction from seizure. Resolved.    Dispo: Anticipated discharge 1-2 days.   Burna CashSantos-Sanchez, Euretha Najarro, MD 12/15/2017, 5:53 AM Pager: [phone]

## 2017-12-15 NOTE — Care Management Note (Signed)
Case Management Note  Patient Details  Name: Reginald Ray MRN: [id number] Date of Birth: 11/11/1976  Subjective/Objective:     Pt admitted with seizures and rhabdo. He is from home with his spouse.               Action/Plan: Plan is for patient to d/c home when medically stable. CM following for d/c needs, physician orders.   Expected Discharge Date:                  Expected Discharge Plan:  Home/Self Care  In-House Referral:     Discharge planning Services     Post Acute Care Choice:    Choice offered to:     DME Arranged:    DME Agency:     HH Arranged:    HH Agency:     Status of Service:  In process, will continue to follow  If discussed at Long Length of Stay Meetings, dates discussed:    Additional Comments:  [name], RN 12/15/2017, 4:02 PM

## 2017-12-15 NOTE — Discharge Summary (Signed)
Name: Reginald Ray MRN: [id number] DOB: 1976-05-23 42 y.o. PCP: Patient, No Pcp Per  Date of Admission: 12/13/2017  4:49 AM Date of Discharge: 12/17/2017 Attending Physician: Dr. Oswaldo Done   Discharge Diagnosis: 1. Seizure 2. Rhabdomyolysis  3. Hypokalemia   Principal Problem:   Seizure [facility]) Active Problems:   Rhabdomyolysis   Hypokalemia   Discharge Medications: Allergies as of 12/17/2017   No Known Allergies     Medication List    TAKE these medications   acetaminophen 500 MG tablet Commonly known as:  TYLENOL Take 1,000 mg by mouth every 6 (six) hours as needed for mild pain.   levETIRAcetam 500 MG tablet Commonly known as:  KEPPRA Take 1 tablet (500 mg total) by mouth 2 (two) times daily.       Disposition and follow-up:   Reginald Ray was discharged from [facility] in Stable condition.  At the hospital follow up visit please address:  1.  Please assess for myalgias as well as volume status. Please assess compliance with Keppra. Please continue to counsel patient on importance of seizure precautions (patient expressed reluctance to stop driving for the next 6 months despite extensive counseling).   2.  Labs / imaging needed at time of follow-up: BMP to check K and Cr,  and CK  3.  Pending labs/ test needing follow-up: None   Follow-up Appointments: Follow-up Information    [facility] Follow up.   Why:  You have a hospital follow up appointment schedule on Friday 2/1 at 2:15pm.  Contact information: [address] [place] [address] [phone]          Hospital Course by problem list: Principal Problem:   Seizure [facility]) Active Problems:   Rhabdomyolysis   Hypokalemia   1. Seizure: Patient presented after witnessed seizure at home and had another witnessed seizure while in the ED. He was evaluated by neurology who recommended MRI brain and EEG and started patient on IV Keppra. MRI  brain and EEG were negative. The etiology of his seizure was attributed to marijuana use. He smokes marijuana on a daily basis but denies smoking marijuana on day of presentation. He was transitioned to PO Keppra and did not have any seizure activity during this admission. He was counseled on seizure precautions and has an outpatient referral to neurology.   2. Rhabdomyolysis: Patient presented with AKI and CK 11K that plateau at 24K. He was aggressively diuresed during this admission with adequate urine output. Renal function remained stable. CK 3K on day of discharge. Patient was instructed to maintain adequate oral hydration to decrease risk of kidney injury.  3. Hypokalemia: Patient's K 2.8 on admission. This was repleted throughout his admission and his K was 3.5 on day of discharge.    Discharge Vitals:   BP (!) 154/80 (BP Location: Right Arm)   Pulse (!) 58   Temp 98.6 F (37 C) (Oral)   Resp 18   Ht 6\' 2"  (1.88 m)   Wt 175 lb (79.4 kg)   SpO2 100%   BMI 22.47 kg/m   Pertinent Labs, Studies, and Procedures:  CBC Latest Ref Rng & Units 12/13/2017  WBC 4.0 - 10.5 K/uL 9.2  Hemoglobin 13.0 - 17.0 g/dL 03.4  Hematocrit 74.2 - 52.0 % 40.1  Platelets 150 - 400 K/uL 305   BMP Latest Ref Rng & Units 12/17/2017 12/16/2017 12/15/2017  Glucose 65 - 99 mg/dL 595(G) 93 79  BUN 6 - 20  mg/dL <1(O) <1(W) 5(L)  Creatinine 0.61 - 1.24 mg/dL 9.60 4.54 0.98  Sodium 135 - 145 mmol/L 138 140 141  Potassium 3.5 - 5.1 mmol/L 3.5 3.4(L) 3.3(L)  Chloride 101 - 111 mmol/L 108 108 110  CO2 22 - 32 mmol/L 22 20(L) 23  Calcium 8.9 - 10.3 mg/dL 1.1(B) 8.9 1.4(N)   CK 11K--> 23K --> 24K --> 1800  Head CT 1/22: FINDINGS: Brain: No mass lesion, intraparenchymal hemorrhage or extra-axial collection. No evidence of acute cortical infarct. Brain parenchyma and CSF-containing spaces are normal for age. Vascular: No hyperdense vessel or unexpected calcification. Skull: Normal visualized skull base, calvarium  and extracranial soft tissues. Sinuses/Orbits: No sinus fluid levels or advanced mucosal thickening. No mastoid effusion. Normal orbits.  MRI brain 1/22: FINDINGS: BRAIN: No reduced diffusion to suggest acute ischemia, hyperacute demyelination or hypercellular tumor. No susceptibility artifact to suggest hemorrhage. The ventricles and sulci are normal for patient's age. No suspicious parenchymal signal, mass or mass effect. No abnormal extra-axial fluid collections.  VASCULAR: Normal major intracranial vascular flow voids present at skull base.  SKULL AND UPPER CERVICAL SPINE: No abnormal sellar expansion. No suspicious calvarial bone marrow signal. Craniocervical junction maintained.  SINUSES/ORBITS: LEFT maxillary mucosal retention cyst, trace paranasal sinus mucosal thickening. Imaged mastoid air cells are well aerated. The included ocular globes and orbital contents are non-suspicious.    Discharge Instructions: Discharge Instructions    Ambulatory referral to Neurology   Complete by:  As directed    Seizure   An appointment is requested in approximately: 4 weeks   Call MD for:   Complete by:  As directed    Please call or return to the ED if you continue to have seizures or experience sudden onset of muscle weakness. Please call if you notice you are making significantly less urine than usual.   Call MD for:  extreme fatigue   Complete by:  As directed    Diet general   Complete by:  As directed    Discharge instructions   Complete by:  As directed    You were admitted to the hospital after having seizures.  The neurologist think your seizure might have been because due to marijuana use.  We recommend against smoking marijuana.  You will need to start taking antiseizure medication.  We will start Keppra 500mg  ( 1 tablet twice a day). We sent a prescription to for a 1 month supply to your pharmacy. We will refer you to an outpatient neurologist that you can follow up  with for your seizures.   We recommend against driving for the next 6 months. We also recommend against swimming, going up ladders, and holding small children for the next 6 months as well. You can injure yourself or others if you have a seizure while performing these activities.   Make sure to stay very well hydrated to avoid any injury to your kidneys. We scheduled you a one time follow up appointment with [place] in our clinic (in the basement of the hospital) next Friday 2/1 at 2:15pm. We will check the function of your kidneys then to make sure it is still normal.   Please call us if you have any questions.   Please make sure to stay very well hydrated. You had some muscle breakdown as a result of the seizures and this can affect you kidney function. While in the hospital your kidney function was normal and the blood test for muscle breakdown was almost back to normal  as well. You will need to make an appointment with your regular doctor so that they can follow up your kidney function and other lab work.   Increase activity slowly   Complete by:  As directed        Signed: Burna CashSantos-Sanchez, Esaw Knippel, MD 12/17/2017, 7:33 PM   Pager: [phone]

## 2017-12-16 ENCOUNTER — Telehealth: Payer: Self-pay | Admitting: General Practice

## 2017-12-16 LAB — BASIC METABOLIC PANEL
ANION GAP: 12 (ref 5–15)
BUN: <5 mg/dL — ABNORMAL LOW (ref 6–20)
CALCIUM: 8.9 mg/dL (ref 8.9–10.3)
CO2: 20 mmol/L — AB (ref 22–32)
Chloride: 108 mmol/L (ref 101–111)
Creatinine, Ser: 1.05 mg/dL (ref 0.61–1.24)
GFR calc Af Amer: >60 mL/min (ref >60)
GFR calc non Af Amer: >60 mL/min (ref >60)
GLUCOSE: 93 mg/dL (ref 65–99)
Potassium: 3.4 mmol/L — ABNORMAL LOW (ref 3.5–5.1)
Sodium: 140 mmol/L (ref 135–145)

## 2017-12-16 LAB — CK
Total CK: 8448 U/L — ABNORMAL HIGH (ref 49–397)
Total CK: 7048 U/L — ABNORMAL HIGH (ref 49–397)

## 2017-12-16 MED ORDER — POTASSIUM CHLORIDE CRYS ER 20 MEQ PO TBCR
40.0000 meq | EXTENDED_RELEASE_TABLET | Freq: Once | ORAL | Status: AC
  Administered 2017-12-16: 40 meq via ORAL
  Filled 2017-12-16: qty 2

## 2017-12-16 NOTE — Telephone Encounter (Signed)
Per Dr Evelene CroonSantos one time visit f/u hospital. Appt. 02/01 215pm

## 2017-12-16 NOTE — Progress Notes (Signed)
   Subjective:  No acute events overnight. Patient continues to feel well and reports no complaints this morning. He is eager to go home. Discussed with patient CK 8K and that we will repeat this afternoon. Maybe discharge home today if CK<5K. Patient voiced understanding and is in agreement with plan. All questions answered.   Objective:  Vital signs in last 24 hours: Vitals:   12/15/17 1814 12/15/17 2143 12/16/17 0210 12/16/17 0559  BP: (!) 162/86 (!) 157/94 139/85 (!) 162/87  Pulse: (!) 58 (!) 56 65 67  Resp: 20 18 16 16   Temp: 99.2 F (37.3 C) 98.4 F (36.9 C) 98.1 F (36.7 C) 98 F (36.7 C)  TempSrc: Oral Oral Oral Oral  SpO2: 100% 100% 100% 100%  Weight:      Height:       General: very pleasant male, well-nourished, well-developed, sitting up in bed talking to family in no acute distress  Cardiac: regular rate and rhythm, nl S1/S2, no murmurs, rubs or gallops  Pulm: CTAB, no wheezes or crackles, no increased work of breathing  Abd: soft, NTND, bowel sounds present  Neuro: A&Ox3, no focal deficits  Ext: warm and well perfused, no peripheral edema noted    Assessment/Plan:  Principal Problem:   Seizure (HCC) Active Problems:   Rhabdomyolysis   Hypokalemia  #Seizures:Patient has 2 witnessed seizures on day of presentation. He has a history of seizure in 1997 thought to be secondary drug use.MRI brain and EEG  Negative. On PO Keppra. No witnessed seizure activity during this admission. Etiology of seizures remains unclear.Could be secondary to mariajuana use, per neurology but patient denies marijuana use on day of event. - Neurology signed off  - Seizure precautions - Continue PO Keppra 500 mg BID - Ativan 1mg  PRN for 1 dose - Will need outpatient neurology referral   #Rhabdomyolysis:Likely secondary to seizure activity. CK 18K-->8K.His renal function remains normal and UOP 2.3L in the last 24 hours. Will repeat CK this afternoon and discharge home if CK  <5K.  - Monitoring renal function - IVF with NS @ 500--> 250cc/hr  - BMP and CK in AM  - Strict I/OS - Ambulate patient   #Hypokalemia:K 3.4 this morning s/p Kdur 40 mEq x1 - Repleting as needed  -Will monitor closely on telemetry -Monitoring in setting of rhabdomyolysis   Dispo: Anticipated discharge in approximately today-1 day(s).   Burna CashSantos-Sanchez, Yago Ludvigsen, MD 12/16/2017, 7:25 AM Pager: [phone]

## 2017-12-16 NOTE — Telephone Encounter (Signed)
Remains inpt, cannot call

## 2017-12-17 LAB — BASIC METABOLIC PANEL
Anion gap: 8 (ref 5–15)
BUN: <5 mg/dL — ABNORMAL LOW (ref 6–20)
CALCIUM: 8.8 mg/dL — AB (ref 8.9–10.3)
CO2: 22 mmol/L (ref 22–32)
CREATININE: 0.95 mg/dL (ref 0.61–1.24)
Chloride: 108 mmol/L (ref 101–111)
GFR calc non Af Amer: >60 mL/min (ref >60)
Glucose, Bld: 102 mg/dL — ABNORMAL HIGH (ref 65–99)
Potassium: 3.5 mmol/L (ref 3.5–5.1)
SODIUM: 138 mmol/L (ref 135–145)

## 2017-12-17 LAB — CK: CK TOTAL: 3746 U/L — AB (ref 49–397)

## 2017-12-17 MED ORDER — LEVETIRACETAM 500 MG PO TABS: 500.0000 mg | ORAL_TABLET | Freq: Two times a day (BID) | ORAL | 0 refills | Status: DC

## 2017-12-17 NOTE — Progress Notes (Signed)
   Subjective:  No acute events overnight. Patient reports feeling well and denies myalgias. Has been ambulating in the unit. Eager to go home. Discussed with patient he will be discharged home today. Advised patient to drink plenty of fluids and stay hydrated as his CK level is under 5K but remains elevated. He voiced understanding. Also counseled patient on seizure precautions and once again emphasized importance of not driving within the next 6 months. He seemed more agreeable to this today and stated he will ask his wife to drive him to work. All questions answered.   Objective:  Vital signs in last 24 hours: Vitals:   12/16/17 1731 12/16/17 2100 12/17/17 0152 12/17/17 0509  BP: (!) 150/90 (!) 154/89 (!) 159/92 (!) 154/80  Pulse: 67 63 (!) 56 (!) 58  Resp: 20 20 18 18   Temp: 98.9 F (37.2 C) 98.9 F (37.2 C) 98.4 F (36.9 C) 98.6 F (37 C)  TempSrc: Oral Oral Oral Oral  SpO2: 100% 100% 100% 100%  Weight:      Height:       General: very pleasant male, well-nourished, well-developed, sleeping in bed in no acute distress  Cardiac: regular rate and rhythm, nl S1/S2, no murmurs, rubs or gallops  Pulm: CTAB, no wheezes or crackles, no increased work of breathing  Abd: soft, NTND, normoactive bowel sounds Neuro: A&Ox3, able to move all 4 extremities spontaneously and without difficulty, no focal deficits noted  Ext: warm and well perfused, no peripheral edema noted    Assessment/Plan:  Principal Problem:   Seizure (HCC) Active Problems:   Rhabdomyolysis   Hypokalemia  #Seizures:Patient has 2 witnessed seizures on day of presentation that are thought to be secondary to marijuana use. He has a history of seizure in 1997 thought to be secondary drug use.MRI brain and EEG  Negative. On PO Keppra. No witnessed seizure activity during this admission.  - Neurology signed off  - Seizure precautions  -Continue POKeppra 500 mg BID - Ativan 1mg  PRN for 1 dose - Will need  outpatient neurology referral  #Rhabdomyolysis:Likely secondary to seizure activity.CK 8K-->3K.His renal functionremains normal. UOP 1L + several unrecorded outputs. Has been ambulating without difficulty. Anticipate discharge home today. Counseled patient on importance of hydration. [facility]  - Monitoring renal function - IVF  - Strict I/OS  #Hypokalemia:K3.5 this morning  -Monitoring in setting of rhabdomyolysis   Dispo: Anticipated discharge today.   Burna CashSantos-Sanchez, Berneta Sconyers, MD 12/17/2017, 7:37 AM Pager: [phone]

## 2017-12-17 NOTE — Progress Notes (Signed)
CKMB = 3,746 this am.  MD notified.

## 2017-12-23 ENCOUNTER — Other Ambulatory Visit: Payer: Self-pay

## 2017-12-23 ENCOUNTER — Ambulatory Visit (INDEPENDENT_AMBULATORY_CARE_PROVIDER_SITE_OTHER): Payer: BLUE CROSS/BLUE SHIELD | Admitting: Internal Medicine

## 2017-12-23 VITALS — BP 142/82 | HR 86 | Temp 98.9°F | Ht 74.0 in | Wt 167.7 lb

## 2017-12-23 DIAGNOSIS — R569 Unspecified convulsions: Secondary | ICD-10-CM | POA: Diagnosis not present

## 2017-12-23 DIAGNOSIS — M6282 Rhabdomyolysis: Secondary | ICD-10-CM

## 2017-12-23 DIAGNOSIS — E876 Hypokalemia: Secondary | ICD-10-CM | POA: Diagnosis not present

## 2017-12-23 MED ORDER — LEVETIRACETAM 500 MG PO TABS: 500.0000 mg | ORAL_TABLET | Freq: Two times a day (BID) | ORAL | 0 refills | Status: DC

## 2017-12-23 NOTE — Progress Notes (Signed)
Internal Medicine Clinic Attending  Case discussed with Dr. Rathoreat the time of the visit. We reviewed the resident's history and exam and pertinent patient test results. I agree with the assessment, diagnosis, and plan of care documented in the resident's note.  

## 2017-12-23 NOTE — Patient Instructions (Addendum)
Mr. [name] it was nice meeting you today.  -Continue taking Keppra as instructed   -Our office will make sure you are scheduled to see neurology.   -Please do not drive until you are seizure-free for at least 6 months.  At that time, please ask a physician if it is safe for you to drive again.  -Please avoid unsupervised activities that might pose danger with sudden loss of consciousness, including bathing in a tub, swimming alone, working at heights, and operating heavy machinery.

## 2017-12-23 NOTE — Progress Notes (Signed)
   CC: Patient is here for a hospital follow-up of seizures.  HPI:  [name] is a 42 y.o. male with no significant past medical history, recently admitted to the hospital for seizures is here for a follow-up. Please see problem based charting for the status of the patient's current and chronic medical conditions.   Past Medical History:  Diagnosis Date  . Seizure (HCC) 1997; 12/13/2017 X 2  . Substance abuse (HCC)    Review of Systems:  Review of Systems  Respiratory: Negative for shortness of breath.   Cardiovascular: Negative for chest pain.  Gastrointestinal: Negative for abdominal pain.  Musculoskeletal: Negative for myalgias.  Neurological: Negative for seizures.    Physical Exam:  Vitals:   12/23/17 1436  BP: (!) 142/82  Pulse: 86  Temp: 98.9 F (37.2 C)  TempSrc: Oral  SpO2: 100%  Weight: 167 lb 11.2 oz (76.1 kg)  Height: 6\' 2"  (1.88 m)   Physical Exam  Constitutional: He is oriented to person, place, and time. He appears well-developed and well-nourished. No distress.  HENT:  Head: Normocephalic and atraumatic.  Mouth/Throat: Oropharynx is clear and moist.  Eyes: Right eye exhibits no discharge. Left eye exhibits no discharge.  Cardiovascular: Normal rate, regular rhythm and intact distal pulses.  Pulmonary/Chest: Effort normal and breath sounds normal. No respiratory distress. He has no wheezes. He has no rales.  Abdominal: Soft. Bowel sounds are normal. He exhibits no distension. There is no tenderness.  Musculoskeletal: He exhibits no edema.  Neurological: He is alert and oriented to person, place, and time.  Skin: Skin is warm and dry.    Assessment & Plan:   See Encounters Tab for problem based charting.  Patient discussed with Dr. Cleda DaubE. Hoffman

## 2017-12-23 NOTE — Telephone Encounter (Signed)
Called pt, vmail has not been setup, no answer

## 2017-12-23 NOTE — Assessment & Plan Note (Signed)
He was found to have rhabdo and AKI in the setting of seizures during his hospitalization.  CK was 3746 on the day of discharge.  Patient denies having any myalgias and reports having adequate oral hydration.  No further seizure activity since his hospital discharge.  Plan -BMP to check renal function -Check CK level

## 2017-12-23 NOTE — Assessment & Plan Note (Signed)
He was found to be hypokalemic during his recent hospital admission.  Unclear etiology of hypokalemia as patient does not take any other medications other than Keppra and occasional Tylenol.  Plan -BMP to check potassium level and renal function

## 2017-12-23 NOTE — Assessment & Plan Note (Signed)
Patient was recently admitted to the hospital for seizures thought to be secondary to marijuana use.  MRI of the brain and EEG were negative.  At present, patient denies having any seizures since his hospital discharge.  Denies using marijuana or any other drugs.  States he is not driving.  He continues to take Keppra 500 mg twice daily.  Plan -Continue Keppra -Seizure precautions -We will make sure he is scheduled to follow-up with neurology

## 2017-12-24 LAB — BMP8+ANION GAP
ANION GAP: 17 mmol/L (ref 10.0–18.0)
BUN/Creatinine Ratio: 10 (ref 9–20)
BUN: 11 mg/dL (ref 6–24)
CALCIUM: 10.4 mg/dL — AB (ref 8.7–10.2)
CO2: 22 mmol/L (ref 20–29)
Chloride: 101 mmol/L (ref 96–106)
Creatinine, Ser: 1.12 mg/dL (ref 0.76–1.27)
GFR calc Af Amer: 93 mL/min/{1.73_m2} (ref >59)
GFR, EST NON AFRICAN AMERICAN: 80 mL/min/{1.73_m2} (ref >59)
Glucose: 62 mg/dL — ABNORMAL LOW (ref 65–99)
POTASSIUM: 4.5 mmol/L (ref 3.5–5.2)
SODIUM: 140 mmol/L (ref 134–144)

## 2017-12-24 LAB — CK: CK TOTAL: 224 U/L — AB (ref 24–204)

## 2017-12-29 NOTE — Telephone Encounter (Signed)
vmail not setup

## 2018-01-02 NOTE — Telephone Encounter (Signed)
vmail not setup

## 2018-01-28 ENCOUNTER — Other Ambulatory Visit: Payer: Self-pay | Admitting: Internal Medicine

## 2018-02-28 ENCOUNTER — Ambulatory Visit (INDEPENDENT_AMBULATORY_CARE_PROVIDER_SITE_OTHER): Payer: BLUE CROSS/BLUE SHIELD | Admitting: Diagnostic Neuroimaging

## 2018-02-28 ENCOUNTER — Encounter (INDEPENDENT_AMBULATORY_CARE_PROVIDER_SITE_OTHER): Payer: Self-pay

## 2018-02-28 ENCOUNTER — Encounter: Payer: Self-pay | Admitting: Diagnostic Neuroimaging

## 2018-02-28 VITALS — BP 127/75 | HR 88 | Ht 74.0 in | Wt 176.2 lb

## 2018-02-28 DIAGNOSIS — G40909 Epilepsy, unspecified, not intractable, without status epilepticus: Secondary | ICD-10-CM

## 2018-02-28 MED ORDER — LEVETIRACETAM 500 MG PO TABS: 500.0000 mg | ORAL_TABLET | Freq: Two times a day (BID) | ORAL | 12 refills | Status: DC

## 2018-02-28 MED ORDER — LEVETIRACETAM 500 MG PO TABS: 500.0000 mg | ORAL_TABLET | Freq: Two times a day (BID) | ORAL | 4 refills | Status: DC

## 2018-02-28 NOTE — Patient Instructions (Signed)
-   continue levetiracetam 500mg twice a day   - According to [place] law, you can not drive unless you are seizure / syncope free for at least 6 months and under physician's care.   - Please maintain precautions. Do not participate in activities where a loss of awareness could harm you or someone else. No swimming alone, no tub bathing, no hot tubs, no driving, no operating motorized vehicles (cars, ATVs, motocycles, etc), lawnmowers, power tools or firearms. No standing at heights, such as rooftops, ladders or stairs. Avoid hot objects such as stoves, heaters, open fires. Wear a helmet when riding a bicycle, scooter, skateboard, etc. and avoid areas of traffic. Set your water heater to 120 degrees or less.   

## 2018-02-28 NOTE — Progress Notes (Signed)
[facility]  PATIENT: Reginald Ray DOB: [birth year]  REFERRING CLINICIAN: Santos-Sanchez, I HISTORY FROM: patient and fiance REASON FOR VISIT: new consult    HISTORICAL  CHIEF COMPLAINT:  Chief Complaint  Patient presents with  . Seizures    rm 7, New Pt, Finacee- [name]    HISTORY OF PRESENT ILLNESS:   42 year old male here for evaluation of seizure.  Patient had one episode of possible seizure or syncope in 1997.  He was not started on antiseizure medicine at that time.  12/13/17 patient came home from third shift at work, and at 4 in the morning had loss of consciousness with generalized convulsions lasting 5 minutes.  EMS was called and patient was taken to the emergency room.  He arrived emergency room around 5 AM.  By 9 AM patient had a second seizure.  Patient was admitted to the hospital for evaluation.  He was started on IV levetiracetam.  Seizure workup was completed.  Patient was discharged on oral levetiracetam 500 mg twice a day.  Since that time patient is doing well.  No further seizures.  No family history of seizure.  Trigger factors may have included decreased sleep in the previous few days leading up to the seizure.   REVIEW OF SYSTEMS: Full 14 system review of systems performed and negative with exception of: As per HPI.  ALLERGIES: No Known Allergies  HOME MEDICATIONS: Outpatient Medications Prior to Visit  Medication Sig Dispense Refill  . acetaminophen (TYLENOL) 500 MG tablet Take 1,000 mg by mouth every 6 (six) hours as needed for mild pain.    Marland Kitchen levETIRAcetam (KEPPRA) 500 MG tablet TAKE 1 TABLET BY MOUTH TWICE A DAY 60 tablet 0   No facility-administered medications prior to visit.     PAST MEDICAL HISTORY: Past Medical History:  Diagnosis Date  . Seizure (HCC) 1997; 12/13/2017 X 2  . Substance abuse (HCC)     PAST SURGICAL HISTORY: Past Surgical History:  Procedure Laterality Date  . NO PAST SURGERIES       FAMILY HISTORY: Family History  Problem Relation Age of Onset  . Hypertension Mother   . Hypertension Father     SOCIAL HISTORY:  Social History   Socioeconomic History  . Marital status: Divorced    Spouse name: Not on file  . Number of children: Not on file  . Years of education: Not on file  . Highest education level: Not on file  Occupational History  . Not on file  Social Needs  . Financial resource strain: Not on file  . Food insecurity:    Worry: Not on file    Inability: Not on file  . Transportation needs:    Medical: Not on file    Non-medical: Not on file  Tobacco Use  . Smoking status: Former Smoker    Packs/day: 1.00    Years: 10.00    Pack years: 10.00    Last attempt to quit: 02/29/2008    Years since quitting: 10.0  . Smokeless tobacco: Never Used  Substance and Sexual Activity  . Alcohol use: Yes    Alcohol/week: 3.0 oz    Types: 5 Cans of beer per week    Comment: 02/28/18 none  . Drug use: Yes    Types: Marijuana    Comment: 12/13/2017 "qd", 02/28/18 "every now andd then"  . Sexual activity: Not Currently  Lifestyle  . Physical activity:    Days per week: Not on file    Minutes  per session: Not on file  . Stress: Not on file  Relationships  . Social connections:    Talks on phone: Not on file    Gets together: Not on file    Attends religious service: Not on file    Active member of club or organization: Not on file    Attends meetings of clubs or organizations: Not on file    Relationship status: Not on file  . Intimate partner violence:    Fear of current or ex partner: Not on file    Emotionally abused: Not on file    Physically abused: Not on file    Forced sexual activity: Not on file  Other Topics Concern  . Not on file  Social History Narrative   Lives with fiancee   Caffeine- ginger ale, coffee- 12 oz daily   [employer]- loads trucks   10th grade education     PHYSICAL EXAM  GENERAL EXAM/CONSTITUTIONAL: Vitals:  Vitals:    02/28/18 1259  BP: 127/75  Pulse: 88  Weight: 176 lb 3.2 oz (79.9 kg)  Height: 6\' 2"  (1.88 m)     Body mass index is 22.62 kg/m.  No exam data present  Patient is in no distress; well developed, nourished and groomed; neck is supple  CARDIOVASCULAR:  Examination of carotid arteries is normal; no carotid bruits  Regular rate and rhythm, no murmurs  Examination of peripheral vascular system by observation and palpation is normal  EYES:  Ophthalmoscopic exam of optic discs and posterior segments is normal; no papilledema or hemorrhages  MUSCULOSKELETAL:  Gait, strength, tone, movements noted in Neurologic exam below  NEUROLOGIC: MENTAL STATUS:  No flowsheet data found.  awake, alert, oriented to person, place and time  recent and remote memory intact  normal attention and concentration  language fluent, comprehension intact, naming intact,   fund of knowledge appropriate  CRANIAL NERVE:   2nd - no papilledema on fundoscopic exam  2nd, 3rd, 4th, 6th - pupils equal and reactive to light, visual fields full to confrontation, extraocular muscles intact, no nystagmus  5th - facial sensation symmetric  7th - facial strength symmetric  8th - hearing intact  9th - palate elevates symmetrically, uvula midline  11th - shoulder shrug symmetric  12th - tongue protrusion midline  MOTOR:   normal bulk and tone, full strength in the BUE, BLE  SENSORY:   normal and symmetric to light touch, temperature, vibration  COORDINATION:   finger-nose-finger, fine finger movements normal  REFLEXES:   deep tendon reflexes present and symmetric  GAIT/STATION:   narrow based gait; romberg is negative    DIAGNOSTIC DATA (LABS, IMAGING, TESTING) - I reviewed patient records, labs, notes, testing and imaging myself where available.  Lab Results  Component Value Date   WBC 9.2 12/13/2017   HGB 13.4 12/13/2017   HCT 40.1 12/13/2017   MCV 92.0 12/13/2017   PLT  305 12/13/2017      Component Value Date/Time   NA 140 12/23/2017 1518   K 4.5 12/23/2017 1518   CL 101 12/23/2017 1518   CO2 22 12/23/2017 1518   GLUCOSE 62 (L) 12/23/2017 1518   GLUCOSE 102 (H) 12/17/2017 0421   BUN 11 12/23/2017 1518   CREATININE 1.12 12/23/2017 1518   CALCIUM 10.4 (H) 12/23/2017 1518   PROT 6.7 12/13/2017 0530   ALBUMIN 4.2 12/13/2017 0530   AST 51 (H) 12/13/2017 0530   ALT 32 12/13/2017 0530   ALKPHOS 76 12/13/2017 0530  BILITOT 1.0 12/13/2017 0530   GFRNONAA 80 12/23/2017 1518   GFRAA 93 12/23/2017 1518   No results found for: CHOL, HDL, LDLCALC, LDLDIRECT, TRIG, CHOLHDL No results found for: WUJW1XHGBA1C No results found for: VITAMINB12 No results found for: TSH   12/13/17 MRI brain [I reviewed images myself and agree with interpretation. -VRP]  1. Limited 4 sequence noncontrast MRI, patient was unable to tolerate further imaging. 2. Normal noncontrast MRI of the head.  12/13/17 EEG  - This is a normal EEG for the patients stated age.  There were no focal, hemispheric or lateralizing features.  No epileptiform activity was recorded.  A normal EEG does not exclude the diagnosis of a seizure disorder and if seizure remains high on the list of differential diagnosis, an ambulatory EEG may be of value.  Clinical correlation is required.     ASSESSMENT AND PLAN  42 y.o. year old male here with generalized seizure disorder.   Dx: seizure disorder (since 1997? Last event 12/13/17)  1. Seizure disorder (HCC)      PLAN:  SEIZURE DISORDER  - continue levetiracetam 500mg  twice a day   - According to [place] law, you can not drive unless you are seizure / syncope free for at least 6 months and under physician's care.   - Please maintain precautions. Do not participate in activities where a loss of awareness could harm you or someone else. No swimming alone, no tub bathing, no hot tubs, no driving, no operating motorized vehicles (cars, ATVs, motocycles, etc),  lawnmowers, power tools or firearms. No standing at heights, such as rooftops, ladders or stairs. Avoid hot objects such as stoves, heaters, open fires. Wear a helmet when riding a bicycle, scooter, skateboard, etc. and avoid areas of traffic. Set your water heater to 120 degrees or less.  Meds ordered this encounter  Medications  . levETIRAcetam (KEPPRA) 500 MG tablet    Sig: Take 1 tablet (500 mg total) by mouth 2 (two) times daily.    Dispense:  60 tablet    Refill:  12   Return in about 1 year (around 03/01/2019) for with NP.  I reviewed images, labs, notes, records myself. I summarized findings and reviewed with patient, for this high risk condition (new onset seizure disorder) requiring high complexity decision making.    Suanne MarkerVIKRAM R. Amberlea Spagnuolo, MD 02/28/2018, 1:12 PM Certified in Neurology, Neurophysiology and Neuroimaging  [facility] [address], [address] [place], [place] [address] [phone]

## 2019-01-14 ENCOUNTER — Emergency Department (HOSPITAL_COMMUNITY)
Admission: EM | Admit: 2019-01-14 | Discharge: 2019-01-14 | Disposition: A | Discharge status: Home / Self Care | Payer: BLUE CROSS/BLUE SHIELD | Attending: Emergency Medicine | Admitting: Emergency Medicine

## 2019-01-14 ENCOUNTER — Emergency Department (HOSPITAL_COMMUNITY): Payer: BLUE CROSS/BLUE SHIELD

## 2019-01-14 ENCOUNTER — Encounter (HOSPITAL_COMMUNITY): Payer: Self-pay | Admitting: Nurse Practitioner

## 2019-01-14 DIAGNOSIS — Z87891 Personal history of nicotine dependence: Secondary | ICD-10-CM | POA: Insufficient documentation

## 2019-01-14 DIAGNOSIS — G40909 Epilepsy, unspecified, not intractable, without status epilepticus: Secondary | ICD-10-CM | POA: Diagnosis not present

## 2019-01-14 DIAGNOSIS — E876 Hypokalemia: Secondary | ICD-10-CM | POA: Insufficient documentation

## 2019-01-14 DIAGNOSIS — F1210 Cannabis abuse, uncomplicated: Secondary | ICD-10-CM

## 2019-01-14 DIAGNOSIS — R569 Unspecified convulsions: Secondary | ICD-10-CM | POA: Diagnosis present

## 2019-01-14 DIAGNOSIS — Z79899 Other long term (current) drug therapy: Secondary | ICD-10-CM | POA: Insufficient documentation

## 2019-01-14 LAB — CBC
HCT: 44.8 % (ref 39.0–52.0)
HEMOGLOBIN: 14.4 g/dL (ref 13.0–17.0)
MCH: 30.8 pg (ref 26.0–34.0)
MCHC: 32.1 g/dL (ref 30.0–36.0)
MCV: 95.9 fL (ref 80.0–100.0)
NRBC: 0 % (ref 0.0–0.2)
Platelets: 319 10*3/uL (ref 150–400)
RBC: 4.67 MIL/uL (ref 4.22–5.81)
RDW: 13.2 % (ref 11.5–15.5)
WBC: 9.1 10*3/uL (ref 4.0–10.5)

## 2019-01-14 LAB — URINALYSIS, ROUTINE W REFLEX MICROSCOPIC
BACTERIA UA: NONE SEEN
Bilirubin Urine: NEGATIVE
Glucose, UA: NEGATIVE mg/dL
Ketones, ur: NEGATIVE mg/dL
Leukocytes,Ua: NEGATIVE
Nitrite: NEGATIVE
Protein, ur: 30 mg/dL — AB
Specific Gravity, Urine: 1.01 (ref 1.005–1.030)
pH: 5 (ref 5.0–8.0)

## 2019-01-14 LAB — RAPID URINE DRUG SCREEN, HOSP PERFORMED
Amphetamines: NOT DETECTED
Barbiturates: NOT DETECTED
Benzodiazepines: POSITIVE — AB
Cocaine: NOT DETECTED
Opiates: NOT DETECTED
Tetrahydrocannabinol: POSITIVE — AB

## 2019-01-14 LAB — BASIC METABOLIC PANEL
ANION GAP: 18 — AB (ref 5–15)
BUN: 9 mg/dL (ref 6–20)
CHLORIDE: 106 mmol/L (ref 98–111)
CO2: 14 mmol/L — ABNORMAL LOW (ref 22–32)
Calcium: 8.9 mg/dL (ref 8.9–10.3)
Creatinine, Ser: 1.5 mg/dL — ABNORMAL HIGH (ref 0.61–1.24)
GFR calc Af Amer: >60 mL/min (ref >60)
GFR calc non Af Amer: 56 mL/min — ABNORMAL LOW (ref >60)
Glucose, Bld: 177 mg/dL — ABNORMAL HIGH (ref 70–99)
POTASSIUM: 2.8 mmol/L — AB (ref 3.5–5.1)
Sodium: 138 mmol/L (ref 135–145)

## 2019-01-14 LAB — MAGNESIUM: Magnesium: 2.5 mg/dL — ABNORMAL HIGH (ref 1.7–2.4)

## 2019-01-14 LAB — CBG MONITORING, ED: Glucose-Capillary: 160 mg/dL — ABNORMAL HIGH (ref 70–99)

## 2019-01-14 MED ORDER — LEVETIRACETAM IN NACL 1500 MG/100ML IV SOLN
1500.0000 mg | Freq: Once | INTRAVENOUS | Status: AC
  Administered 2019-01-14: 1500 mg via INTRAVENOUS
  Filled 2019-01-14: qty 100

## 2019-01-14 MED ORDER — SODIUM CHLORIDE 0.9 % IV BOLUS (SEPSIS)
1000.0000 mL | Freq: Once | INTRAVENOUS | Status: AC
  Administered 2019-01-14: 1000 mL via INTRAVENOUS

## 2019-01-14 MED ORDER — POTASSIUM CHLORIDE CRYS ER 20 MEQ PO TBCR
40.0000 meq | EXTENDED_RELEASE_TABLET | Freq: Once | ORAL | Status: AC
  Administered 2019-01-14: 40 meq via ORAL
  Filled 2019-01-14: qty 2

## 2019-01-14 MED ORDER — POTASSIUM CHLORIDE ER 10 MEQ PO TBCR: 10.0000 meq | EXTENDED_RELEASE_TABLET | Freq: Every day | ORAL | 0 refills | Status: DC

## 2019-01-14 NOTE — ED Notes (Signed)
Bed: WA07 Expected date:  Expected time:  Means of arrival:  Comments: 43 yo seizure

## 2019-01-14 NOTE — ED Notes (Signed)
Patient transported to CT 

## 2019-01-14 NOTE — Discharge Instructions (Signed)
Continue taking your Keppra.  Drink lots of fluids.  Follow-up with your neurologist.  Stop smoking marijuana. Thank you for allowing me to care for you today. Please return to the emergency department if you have new or worsening symptoms. Take your medications as instructed.

## 2019-01-14 NOTE — ED Triage Notes (Signed)
Pt is presented by EMS who report that they were called to pt's residence, pt reportedly woke up from a nap and had a seizure while in the bathroom. He was somewhat aggressive and arrived in 4 point restraints and was also chemically restrained with IM Haldol 5 mg and Versed. He seems somewhat confused at this time, consistent with a postictal state. He reports he takes keppra for his seizure.

## 2019-01-14 NOTE — ED Provider Notes (Signed)
[facility]-EMERGENCY DEPT Provider Note   CSN: [id number] Arrival date & time: 01/14/19  1719    History   Chief Complaint Chief Complaint  Patient presents with  . Seizures    HPI Reginald Ray is a 43 y.o. male.     Patient is a 43 year old African-American male with past medical history of seizure disorder who presents to the emergency department via EMS for a seizure.  Patient had his first seizure in January 2019.  He was admitted at this time and had negative EEG and MRI.  He followed up with neurology and has been taking Keppra twice daily 500 mg daily since then.  Seizures were attributed to marijuana use.  Patient continues to smoke marijuana.  Reports that he is taking his Keppra daily and has not missed any doses.  Reports the last thing he remembers is waking up this morning.  Reports that he does not remember the seizure prior to the seizure.  Reports that his head hurts and that he hit his head on something.  Denies any other symptoms and asks when he can go home.  He reports occasional alcohol use but no recent alcohol use.  Denies any other drug use but states marijuana a couple of days ago.  Denies any fever, chills, URI symptoms, dizziness, lightheadedness.  Per EMS report he was combative and postictal.  At this time he is alert and oriented x3.     Past Medical History:  Diagnosis Date  . Seizure (HCC) 1997; 12/13/2017 X 2  . Substance abuse [facility])     Patient Active Problem List   Diagnosis Date Noted  . Seizure (HCC) 12/13/2017  . Rhabdomyolysis 12/13/2017  . Hypokalemia 12/13/2017    Past Surgical History:  Procedure Laterality Date  . NO PAST SURGERIES          Home Medications    Prior to Admission medications   Medication Sig Start Date End Date Taking? Authorizing Provider  acetaminophen (TYLENOL) 500 MG tablet Take 1,000 mg by mouth every 6 (six) hours as needed for mild pain.   Yes [provider]    levETIRAcetam (KEPPRA) 500 MG tablet Take 1 tablet (500 mg total) by mouth 2 (two) times daily. 02/28/18  Yes Penumalli, Glenford Bayley, MD  potassium chloride (K-DUR) 10 MEQ tablet Take 1 tablet (10 mEq total) by mouth daily for 5 days. 01/14/19 01/19/19  Arlyn Dunning, PA-C    Family History Family History  Problem Relation Age of Onset  . Hypertension Mother   . Hypertension Father     Social History Social History   Tobacco Use  . Smoking status: Former Smoker    Packs/day: 1.00    Years: 10.00    Pack years: 10.00    Last attempt to quit: 02/29/2008    Years since quitting: 10.8  . Smokeless tobacco: Never Used  Substance Use Topics  . Alcohol use: Yes    Alcohol/week: 5.0 standard drinks    Types: 5 Cans of beer per week    Comment: 02/28/18 none  . Drug use: Yes    Types: Marijuana    Comment: 12/13/2017 "qd", 02/28/18 "every now andd then"     Allergies   Patient has no known allergies.   Review of Systems Review of Systems  Constitutional: Negative for appetite change, chills and fever.  HENT: Negative for ear pain and sore throat.   Eyes: Negative for pain and visual disturbance.  Respiratory: Negative for cough  and shortness of breath.   Cardiovascular: Negative for chest pain and palpitations.  Gastrointestinal: Negative for abdominal pain and vomiting.  Genitourinary: Negative for dysuria and hematuria.  Musculoskeletal: Negative for arthralgias and back pain.  Skin: Negative for color change and rash.  Neurological: Positive for seizures and headaches. Negative for dizziness, syncope, facial asymmetry and light-headedness.  All other systems reviewed and are negative.    Physical Exam Updated Vital Signs BP 127/79 (BP Location: Left Arm)   Pulse 91   Temp 97.6 F (36.4 C)   Resp 20   SpO2 97%   Physical Exam Vitals signs and nursing note reviewed.  HENT:     Head: Normocephalic. No raccoon eyes, Battle's sign, abrasion, masses, right periorbital  erythema, left periorbital erythema or laceration. Hair is normal.     Jaw: There is normal jaw occlusion.      Right Ear: Tympanic membrane normal.     Left Ear: Tympanic membrane normal.     Nose: Nose normal. No nasal deformity.     Right Nostril: No epistaxis.     Mouth/Throat:     Mouth: Mucous membranes are moist.  Eyes:     Conjunctiva/sclera: Conjunctivae normal.     Pupils: Pupils are equal, round, and reactive to light.  Neck:     Musculoskeletal: Full passive range of motion without pain. No neck rigidity, pain with movement, spinous process tenderness or muscular tenderness.  Cardiovascular:     Rate and Rhythm: Normal rate and regular rhythm.  Pulmonary:     Effort: Pulmonary effort is normal.     Breath sounds: Normal breath sounds. No wheezing, rhonchi or rales.  Musculoskeletal:     Right lower leg: No edema.     Left lower leg: No edema.  Skin:    General: Skin is warm.     Capillary Refill: Capillary refill takes less than 2 seconds.  Neurological:     General: No focal deficit present.     Mental Status: He is alert and oriented to person, place, and time.     Cranial Nerves: Cranial nerves are intact.     Sensory: Sensation is intact.     Motor: Motor function is intact.  Psychiatric:        Mood and Affect: Mood normal.      ED Treatments / Results  Labs (all labs ordered are listed, but only abnormal results are displayed) Labs Reviewed  BASIC METABOLIC PANEL - Abnormal; Notable for the following components:      Result Value   Potassium 2.8 (*)    CO2 14 (*)    Glucose, Bld 177 (*)    Creatinine, Ser 1.50 (*)    GFR calc non Af Amer 56 (*)    Anion gap 18 (*)    All other components within normal limits  URINALYSIS, ROUTINE W REFLEX MICROSCOPIC - Abnormal; Notable for the following components:   Hgb urine dipstick LARGE (*)    Protein, ur 30 (*)    All other components within normal limits  RAPID URINE DRUG SCREEN, HOSP PERFORMED -  Abnormal; Notable for the following components:   Benzodiazepines POSITIVE (*)    Tetrahydrocannabinol POSITIVE (*)    All other components within normal limits  MAGNESIUM - Abnormal; Notable for the following components:   Magnesium 2.5 (*)    All other components within normal limits  CBG MONITORING, ED - Abnormal; Notable for the following components:   Glucose-Capillary 160 (*)  All other components within normal limits  CBC  LEVETIRACETAM LEVEL  CBG MONITORING, ED    EKG None  Radiology Ct Head Wo Contrast  Result Date: 01/14/2019 CLINICAL DATA:  Seizure. EXAM: CT HEAD WITHOUT CONTRAST TECHNIQUE: Contiguous axial images were obtained from the base of the skull through the vertex without intravenous contrast. COMPARISON:  12/13/2017 FINDINGS: Brain: There is no evidence for acute hemorrhage, hydrocephalus, mass lesion, or abnormal extra-axial fluid collection. No definite CT evidence for acute infarction. Vascular: No hyperdense vessel or unexpected calcification. Skull: No evidence for fracture. No worrisome lytic or sclerotic lesion. Sinuses/Orbits: The visualized paranasal sinuses and mastoid air cells are clear. Visualized portions of the globes and intraorbital fat are unremarkable. Other: None. IMPRESSION: No acute intracranial abnormality. Electronically Signed   By: Kennith Center M.D.   On: 01/14/2019 19:46    Procedures Procedures (including critical care time)  Medications Ordered in ED Medications  sodium chloride 0.9 % bolus 1,000 mL (0 mLs Intravenous Stopped 01/14/19 2049)  levETIRAcetam (KEPPRA) IVPB 1500 mg/ 100 mL premix (0 mg Intravenous Stopped 01/14/19 1928)  potassium chloride SA (K-DUR,KLOR-CON) CR tablet 40 mEq (40 mEq Oral Given 01/14/19 1939)     Initial Impression / Assessment and Plan / ED Course  I have reviewed the triage vital signs and the nursing notes.  Pertinent labs & imaging results that were available during my care of the patient were  reviewed by me and considered in my medical decision making (see chart for details).  Clinical Course as of Jan 14 2111  Sun Jan 14, 2019  1902 43 y/o male with seizure disorder related to [facility] use here for breakthrough seizure today. Fell and hit head. Reports taking keppra BID without skipping doses. Plan is labs, head CT for trauma. Keppra loading dose.    [KM]  2053 Patient is asking to go home. Patient is at his baseline with no complaints. Patient's lab significant for +THC in urine, hypokalemia of 2.8 which was treated with oral potassium, creatinine of 1.5 which was treated with IV fluids. Patient encouraged to drink plenty of water, take medications as prescribed, follow up with neuro and return if new or worse symptoms. Case discussed with Dr. Rush Landmark and plan agreed upon   [KM]    Clinical Course User Index [KM] Arlyn Dunning, PA-C       Based on review of vitals, medical screening exam, lab work and/or imaging, there does not appear to be an acute, emergent etiology for the patient's symptoms. Counseled pt on good return precautions and encouraged both PCP and ED follow-up as needed.  Prior to discharge, I also discussed incidental imaging findings with patient in detail and advised appropriate, recommended follow-up in detail.  Clinical Impression: 1. Seizure disorder (HCC)   2. Tetrahydrocannabinol (THC) use disorder, mild, abuse   3. Hypokalemia     Disposition: Discharge   This note was prepared with assistance of Dragon voice recognition software. Occasional wrong-word or sound-a-like substitutions may have occurred due to the inherent limitations of voice recognition software.   Final Clinical Impressions(s) / ED Diagnoses   Final diagnoses:  Seizure disorder (HCC)  Tetrahydrocannabinol (THC) use disorder, mild, abuse  Hypokalemia    ED Discharge Orders         Ordered    potassium chloride (K-DUR) 10 MEQ tablet  Daily     01/14/19 2111            Jeral Pinch 01/14/19 2112  Tegeler, Canary Brim, MD 01/15/19 [phone]

## 2019-01-17 LAB — LEVETIRACETAM LEVEL: Levetiracetam Lvl: 9.2 ug/mL — ABNORMAL LOW (ref 10.0–40.0)

## 2019-01-18 ENCOUNTER — Telehealth: Payer: Self-pay

## 2019-01-18 NOTE — Telephone Encounter (Signed)
Unable to get in contact with the patient. I left message for the patient to return my call when gets a chance. Office number has been provided.

## 2019-01-18 NOTE — Telephone Encounter (Signed)
-----   Message from Shawnie Dapper, NP sent at 01/18/2019  8:56 AM EST ----- Can you call to see if patient wishes to be seen sooner than April. We can see him next week to ensure he has medication and is doing well if he wishes.

## 2019-01-18 NOTE — Telephone Encounter (Signed)
-----   Message from Amy Lomax, NP sent at 01/18/2019  8:56 AM EST ----- Can you call to see if patient wishes to be seen sooner than April. We can see him next week to ensure he has medication and is doing well if he wishes. 

## 2019-01-18 NOTE — Telephone Encounter (Signed)
Spoke with the patient and he stated that he would like to come in next week but he needs to check his schedule before he could confirm a date and time. He stated that he would call our office tomorrow( 01/19/2019). Office number was provided. He was very appreciative for the phone call.

## 2019-02-28 ENCOUNTER — Telehealth: Payer: Self-pay | Admitting: *Deleted

## 2019-02-28 NOTE — Telephone Encounter (Signed)
LMVM for pt to call back. Due to current COVID 19 pandemic, our office is severely reducing in office visits for at least the next 2 weeks, in order to minimize the risk to our patients and healthcare providers.  We are converting appts to VIDEO or TELE at this time.  Please return call.

## 2019-03-01 NOTE — Telephone Encounter (Signed)
LMVM for pt again on home # to return call.

## 2019-03-05 ENCOUNTER — Ambulatory Visit: Payer: BLUE CROSS/BLUE SHIELD | Admitting: Family Medicine

## 2019-03-05 ENCOUNTER — Ambulatory Visit: Payer: BLUE CROSS/BLUE SHIELD | Admitting: Nurse Practitioner

## 2019-03-05 NOTE — Telephone Encounter (Signed)
LMVM for [facility], (ok per DPR). Cancelled appt for today.  Please have pt call and reschedule.  Thanks.

## 2019-03-26 ENCOUNTER — Other Ambulatory Visit: Payer: Self-pay | Admitting: Diagnostic Neuroimaging

## 2019-03-29 NOTE — Telephone Encounter (Signed)
LMVM for pt to call back re: received request for sz medication.

## 2019-03-30 ENCOUNTER — Other Ambulatory Visit (HOSPITAL_COMMUNITY): Payer: Self-pay | Admitting: Neurology

## 2019-03-30 MED ORDER — LEVETIRACETAM 500 MG PO TABS: 500.0000 mg | ORAL_TABLET | Freq: Two times a day (BID) | ORAL | 4 refills | Status: DC

## 2019-03-30 NOTE — Telephone Encounter (Signed)
I returned call from [facility] pharmacist asking Nonda Lou authorization for keppra for this patient which I ok ed.

## 2019-04-30 ENCOUNTER — Emergency Department (HOSPITAL_COMMUNITY)
Admission: EM | Admit: 2019-04-30 | Discharge: 2019-04-30 | Disposition: A | Discharge status: Home / Self Care | Payer: BC Managed Care – PPO | Attending: Emergency Medicine | Admitting: Emergency Medicine

## 2019-04-30 DIAGNOSIS — R569 Unspecified convulsions: Secondary | ICD-10-CM | POA: Diagnosis not present

## 2019-04-30 DIAGNOSIS — Z87891 Personal history of nicotine dependence: Secondary | ICD-10-CM | POA: Insufficient documentation

## 2019-04-30 LAB — COMPREHENSIVE METABOLIC PANEL
ALT: 32 U/L (ref 0–44)
AST: 44 U/L — ABNORMAL HIGH (ref 15–41)
Albumin: 4.1 g/dL (ref 3.5–5.0)
Alkaline Phosphatase: 91 U/L (ref 38–126)
Anion gap: 23 — ABNORMAL HIGH (ref 5–15)
BUN: 11 mg/dL (ref 6–20)
CO2: 12 mmol/L — ABNORMAL LOW (ref 22–32)
Calcium: 9.4 mg/dL (ref 8.9–10.3)
Chloride: 105 mmol/L (ref 98–111)
Creatinine, Ser: 1.58 mg/dL — ABNORMAL HIGH (ref 0.61–1.24)
GFR calc Af Amer: >60 mL/min (ref >60)
GFR calc non Af Amer: 52 mL/min — ABNORMAL LOW (ref >60)
Glucose, Bld: 152 mg/dL — ABNORMAL HIGH (ref 70–99)
Potassium: 3.1 mmol/L — ABNORMAL LOW (ref 3.5–5.1)
Sodium: 140 mmol/L (ref 135–145)
Total Bilirubin: 0.7 mg/dL (ref 0.3–1.2)
Total Protein: 6.5 g/dL (ref 6.5–8.1)

## 2019-04-30 LAB — CBC WITH DIFFERENTIAL/PLATELET
Abs Immature Granulocytes: 0.04 10*3/uL (ref 0.00–0.07)
Basophils Absolute: 0.1 10*3/uL (ref 0.0–0.1)
Basophils Relative: 1 %
Eosinophils Absolute: 0.1 10*3/uL (ref 0.0–0.5)
Eosinophils Relative: 1 %
HCT: 42.5 % (ref 39.0–52.0)
Hemoglobin: 14.3 g/dL (ref 13.0–17.0)
Immature Granulocytes: 0 %
Lymphocytes Relative: 30 %
Lymphs Abs: 2.7 10*3/uL (ref 0.7–4.0)
MCH: 30.9 pg (ref 26.0–34.0)
MCHC: 33.6 g/dL (ref 30.0–36.0)
MCV: 91.8 fL (ref 80.0–100.0)
Monocytes Absolute: 0.6 10*3/uL (ref 0.1–1.0)
Monocytes Relative: 7 %
Neutro Abs: 5.5 10*3/uL (ref 1.7–7.7)
Neutrophils Relative %: 61 %
Platelets: 347 10*3/uL (ref 150–400)
RBC: 4.63 MIL/uL (ref 4.22–5.81)
RDW: 13 % (ref 11.5–15.5)
WBC: 9 10*3/uL (ref 4.0–10.5)
nRBC: 0 % (ref 0.0–0.2)

## 2019-04-30 LAB — ETHANOL: Alcohol, Ethyl (B): <10 mg/dL (ref <10)

## 2019-04-30 MED ORDER — LORAZEPAM 2 MG/ML IJ SOLN
1.0000 mg | Freq: Once | INTRAMUSCULAR | Status: AC | PRN
  Administered 2019-04-30: 1 mg via INTRAVENOUS
  Filled 2019-04-30: qty 1

## 2019-04-30 MED ORDER — SODIUM CHLORIDE 0.9 % IV BOLUS
500.0000 mL | Freq: Once | INTRAVENOUS | Status: AC
  Administered 2019-04-30: 19:00:00 500 mL via INTRAVENOUS

## 2019-04-30 NOTE — ED Notes (Signed)
Pt awake, drinking, talking normally. AAO x4. Noncombative and cooperative.

## 2019-04-30 NOTE — ED Notes (Signed)
Pt doing well out of restraints. Resting with eyes closed.

## 2019-04-30 NOTE — ED Notes (Signed)
RN attempted to call staffing for safety sitter so that restrains could come off. Staffing says there are none available.

## 2019-04-30 NOTE — Discharge Instructions (Signed)

## 2019-04-30 NOTE — ED Notes (Signed)
RN took pt out of restraints pt appears to be AAO x4. Pt requesting numerous times to be out of restraints. Pt room closer to Nurses station. Frequently rounding.

## 2019-04-30 NOTE — ED Notes (Addendum)
RN came in and pt was on bilateral soft arm restraints. Leg restraints in bed but not on pt. I requested to have an order put in. Order is in now. Pt AAO x4. Requesting to be out of restraints. RN was told pt was combative and attempting to get out of bed when upon arrival.

## 2019-04-30 NOTE — ED Provider Notes (Signed)
Emergency Department Provider Note   I have reviewed the triage vital signs and the nursing notes.   HISTORY  Chief Complaint No chief complaint on file.   HPI Reginald Ray is a 43 y.o. male with PMH of substance abuse and seizure disorder presents to the ED with seizure witnessed at home and post-ictal agitation. Patient arrives by EMS.  They state that family heard loud noises on the floor below them and went to check on the patient.  They saw him having a seizure.  No estimate to how long this lasted.  The seizure resolved without intervention.  They called 911.  When EMS arrived the patient was combative.  Family state that he has been combative after seizures in the past.  EMS administered Versed 5 mg on scene and were able to restrain the patient with soft restraints in route.   Level 5 caveat: Post-ictal.   Past Medical History:  Diagnosis Date  . Seizure ([place]) 1997; 12/13/2017 X 2  . Substance abuse [facility])     Patient Active Problem List   Diagnosis Date Noted  . Seizure ([place]) 12/13/2017  . Rhabdomyolysis 12/13/2017  . Hypokalemia 12/13/2017    Past Surgical History:  Procedure Laterality Date  . NO PAST SURGERIES      Allergies Patient has no known allergies.  Family History  Problem Relation Age of Onset  . Hypertension Mother   . Hypertension Father     Social History Social History   Tobacco Use  . Smoking status: Former Smoker    Packs/day: 1.00    Years: 10.00    Pack years: 10.00    Last attempt to quit: 02/29/2008    Years since quitting: 11.1  . Smokeless tobacco: Never Used  Substance Use Topics  . Alcohol use: Yes    Alcohol/week: 5.0 standard drinks    Types: 5 Cans of beer per week    Comment: 02/28/18 none  . Drug use: Yes    Types: Marijuana    Comment: 12/13/2017 "qd", 02/28/18 "every now andd then"    Review of Systems  Level 5 caveat: AMS ____________________________________________   PHYSICAL EXAM:  VITAL SIGNS: Vitals:    04/30/19 2015 04/30/19 2030  BP: 114/69 124/73  Pulse:    Resp: (!) 23 (!) 30  Temp:    SpO2:      Constitutional: Alert but confused. Able to be re-directed verbally to some extent. No acute distress. Eyes: Conjunctivae are normal.  Head: Atraumatic. Nose: No congestion/rhinnorhea. Mouth/Throat: Mucous membranes are moist.   Neck: No stridor. Cardiovascular: Normal rate, regular rhythm. Respiratory: Normal respiratory effort.  No retractions. Lungs CTAB. Gastrointestinal: No distention.  Musculoskeletal: No gross deformities of extremities. Neurologic: Confusion noted. Moving all extremities. Exam limited by post-ictal state. No gross, focal neuro deficits.  Skin:  Skin is warm, dry and intact. No rash noted.  ____________________________________________   LABS (all labs ordered are listed, but only abnormal results are displayed)  Labs Reviewed  COMPREHENSIVE METABOLIC PANEL - Abnormal; Notable for the following components:      Result Value   Potassium 3.1 (*)    CO2 12 (*)    Glucose, Bld 152 (*)    Creatinine, Ser 1.58 (*)    AST 44 (*)    GFR calc non Af Amer 52 (*)    Anion gap 23 (*)    All other components within normal limits  ETHANOL  CBC WITH DIFFERENTIAL/PLATELET   ____________________________________________  EKG  EKG Interpretation  Date/Time:  Monday April 30 2019 18:29:33 EDT Ventricular Rate:  121 PR Interval:    QRS Duration: 102 QT Interval:  340 QTC Calculation: 483 R Axis:   152 Text Interpretation:  Sinus tachycardia Consider right atrial enlargement Right ventricular hypertrophy Borderline prolonged QT interval No STEMI  Confirmed by Alona BeneLong, Joshua [phone]([id number]) on 04/30/2019 8:57:12 PM       ____________________________________________  RADIOLOGY  None  ____________________________________________   PROCEDURES  Procedure(s) performed:   Procedures  CRITICAL CARE Performed by: Maia PlanJoshua G Long Total critical care time: 35 minutes  Critical care time was exclusive of separately billable procedures and treating other patients. Critical care was necessary to treat or prevent imminent or life-threatening deterioration. Critical care was time spent personally by me on the following activities: development of treatment plan with patient and/or surrogate as well as nursing, discussions with consultants, evaluation of patient's response to treatment, examination of patient, obtaining history from patient or surrogate, ordering and performing treatments and interventions, ordering and review of laboratory studies, ordering and review of radiographic studies, pulse oximetry and re-evaluation of patient's condition.  Alona BeneJoshua Long, MD Emergency Medicine  ____________________________________________   INITIAL IMPRESSION / ASSESSMENT AND PLAN / ED COURSE  Pertinent labs & imaging results that were available during my care of the patient were reviewed by me and considered in my medical decision making (see chart for details).   Patient presents to the emergency department for evaluation after seizure.  He was combative on scene which is typical of his post ictal events in the past.  Patient given Versed prior to my evaluation.  He is continuing to have some agitation but EMS reports this is much improved from on scene.  No additional meds at this time.  Patient is trying to get out of bed at times.  We will apply soft restraints and frequent verbal redirection to avoid additional medications.  Sending screening labs.  Will defer imaging for now.  08:45 PM  Mental status improving. Patient responding appropriately. Drinking fluids. Remains drowsy. Will continue to observe in the ED.   10:00 PM  Patient now fully awake and alert.  He is oriented.  Reevaluated with no acute findings.  Patient states that he has been compliant with his medicines but was drinking last night and got little sleep.  Suspect that this led to his breakthrough  seizure.  No changes in medications at this time.  I encouraged him to follow-up with neurology and provided contact information at discharge.  Patient given verbal and written instructions to not drive a car due to his seizures until cleared to do so by his neurologist.  Patient verbalizes understanding.  He is calling for a ride home now. ____________________________________________  FINAL CLINICAL IMPRESSION(S) / ED DIAGNOSES  Final diagnoses:  Seizure (HCC)    MEDICATIONS GIVEN DURING THIS VISIT:  Medications  LORazepam (ATIVAN) injection 1 mg (1 mg Intravenous Given 04/30/19 1851)  sodium chloride 0.9 % bolus 500 mL (0 mLs Intravenous Stopped 04/30/19 1927)    Note:  This document was prepared using Dragon voice recognition software and may include unintentional dictation errors.  Alona BeneJoshua Long, MD Emergency Medicine    Long, Arlyss RepressJoshua G, MD 04/30/19 2201

## 2019-04-30 NOTE — ED Notes (Addendum)
Mother in front lobby. Salvad[facility] Farber [phone]

## 2019-04-30 NOTE — ED Triage Notes (Signed)
Per EMS reported sz at home.  Girlfriend heard activity upstairs and suspected sz.  Pt has hx of seizures and reports he has been taking his meds.  Upon arrival pt combative and in x 4 restraints.  Moved to ED stretcher and UE soft restraints applied.  Pt diaphoretic and rambling speech.

## 2019-07-02 ENCOUNTER — Other Ambulatory Visit: Payer: Self-pay

## 2019-07-02 ENCOUNTER — Encounter (HOSPITAL_COMMUNITY): Payer: Self-pay

## 2019-07-02 ENCOUNTER — Emergency Department (HOSPITAL_COMMUNITY)
Admission: EM | Admit: 2019-07-02 | Discharge: 2019-07-02 | Disposition: A | Discharge status: Home / Self Care | Payer: BC Managed Care – PPO | Attending: Emergency Medicine | Admitting: Emergency Medicine

## 2019-07-02 DIAGNOSIS — Z87891 Personal history of nicotine dependence: Secondary | ICD-10-CM | POA: Insufficient documentation

## 2019-07-02 DIAGNOSIS — R569 Unspecified convulsions: Secondary | ICD-10-CM | POA: Insufficient documentation

## 2019-07-02 HISTORY — DX: Unspecified convulsions: R56.9

## 2019-07-02 LAB — CBC WITH DIFFERENTIAL/PLATELET
Abs Immature Granulocytes: 0.07 10*3/uL (ref 0.00–0.07)
Basophils Absolute: 0 10*3/uL (ref 0.0–0.1)
Basophils Relative: 0 %
Eosinophils Absolute: 0.1 10*3/uL (ref 0.0–0.5)
Eosinophils Relative: 1 %
HCT: 45.8 % (ref 39.0–52.0)
Hemoglobin: 15.2 g/dL (ref 13.0–17.0)
Immature Granulocytes: 1 %
Lymphocytes Relative: 14 %
Lymphs Abs: 1.4 10*3/uL (ref 0.7–4.0)
MCH: 31.1 pg (ref 26.0–34.0)
MCHC: 33.2 g/dL (ref 30.0–36.0)
MCV: 93.7 fL (ref 80.0–100.0)
Monocytes Absolute: 0.6 10*3/uL (ref 0.1–1.0)
Monocytes Relative: 6 %
Neutro Abs: 7.4 10*3/uL (ref 1.7–7.7)
Neutrophils Relative %: 78 %
Platelets: 163 10*3/uL (ref 150–400)
RBC: 4.89 MIL/uL (ref 4.22–5.81)
RDW: 13.3 % (ref 11.5–15.5)
WBC: 9.5 10*3/uL (ref 4.0–10.5)
nRBC: 0 % (ref 0.0–0.2)

## 2019-07-02 LAB — BASIC METABOLIC PANEL
Anion gap: 21 — ABNORMAL HIGH (ref 5–15)
BUN: 8 mg/dL (ref 6–20)
CO2: 12 mmol/L — ABNORMAL LOW (ref 22–32)
Calcium: 8.9 mg/dL (ref 8.9–10.3)
Chloride: 106 mmol/L (ref 98–111)
Creatinine, Ser: 1.58 mg/dL — ABNORMAL HIGH (ref 0.61–1.24)
GFR calc Af Amer: >60 mL/min (ref >60)
GFR calc non Af Amer: 52 mL/min — ABNORMAL LOW (ref >60)
Glucose, Bld: 134 mg/dL — ABNORMAL HIGH (ref 70–99)
Potassium: 4 mmol/L (ref 3.5–5.1)
Sodium: 139 mmol/L (ref 135–145)

## 2019-07-02 MED ORDER — LEVETIRACETAM IN NACL 1000 MG/100ML IV SOLN
1000.0000 mg | Freq: Once | INTRAVENOUS | Status: AC
  Administered 2019-07-02: 18:00:00 1000 mg via INTRAVENOUS
  Filled 2019-07-02: qty 100

## 2019-07-02 MED ORDER — LORAZEPAM 2 MG/ML IJ SOLN
2.0000 mg | Freq: Once | INTRAMUSCULAR | Status: AC
  Administered 2019-07-02: 2 mg via INTRAMUSCULAR

## 2019-07-02 MED ORDER — LEVETIRACETAM 500 MG PO TABS
500.0000 mg | ORAL_TABLET | Freq: Once | ORAL | Status: AC
  Administered 2019-07-02: 500 mg via ORAL
  Filled 2019-07-02: qty 1

## 2019-07-02 NOTE — ED Notes (Signed)
signiture pad is unavailable, pt verbalized understanding of d/c instructions

## 2019-07-02 NOTE — ED Provider Notes (Signed)
[facility] EMERGENCY DEPARTMENT Provider Note   CSN: [id number] Arrival date & time: 07/02/19  1707    History   Chief Complaint Chief Complaint  Patient presents with  . Seizures    HPI Reginald Ray is a 43 y.o. male with history of seizure disorder on Keppra who presents to the ED via EMS complaining of a seizure this afternoon that lasted approximately 2 to 3 minutes.  EMS reports that patient's family witnessed a generalized seizure that lasted for approximately 2 to 3 minutes and was followed by postictal phase during which patient was combative.  Family reports patient has been combative during his postictal phases in the past.  EMS reports giving patient 5 mg of IM Versed in route to the ED for agitation.  On arrival to the ED, patient is in restraints and remains agitated following IM Versed.  He was given 2 mg of IM Ativan for continued agitation.  After patient returned to baseline, he reported his medication was running low so he has been taking half of his normal dose for the past 3 days.  He reports he was planning on picking up his prescription today.  He denies any recent illness, headache, numbness, tingling, focal weakness, fever, chills, cough, shortness of breath, chest pain, abdominal pain, nausea, vomiting, diarrhea, or any other complaints.     The history is provided by the patient and the EMS personnel.    Past Medical History:  Diagnosis Date  . Seizure ([place]) 1997; 12/13/2017 X 2  . Seizures ([place])   . Substance abuse [facility])     Patient Active Problem List   Diagnosis Date Noted  . Seizure ([place]) 12/13/2017  . Rhabdomyolysis 12/13/2017  . Hypokalemia 12/13/2017    Past Surgical History:  Procedure Laterality Date  . NO PAST SURGERIES          Home Medications    Prior to Admission medications   Medication Sig Start Date End Date Taking? Authorizing Provider  acetaminophen (TYLENOL) 500 MG tablet Take 1,000 mg by mouth every 6  (six) hours as needed for mild pain.    [provider]  levETIRAcetam (KEPPRA) 500 MG tablet TAKE 1 TABLET BY MOUTH TWICE A DAY 03/30/19   Garvin Fila, MD  levETIRAcetam (KEPPRA) 500 MG tablet Take 1 tablet (500 mg total) by mouth 2 (two) times daily. 03/30/19   Garvin Fila, MD  potassium chloride (K-DUR) 10 MEQ tablet Take 1 tablet (10 mEq total) by mouth daily for 5 days. Patient not taking: Reported on 04/30/2019 01/14/19 01/19/19  Alveria Apley PA-C    Family History Family History  Problem Relation Age of Onset  . Hypertension Mother   . Hypertension Father     Social History Social History   Tobacco Use  . Smoking status: Former Smoker    Packs/day: 1.00    Years: 10.00    Pack years: 10.00    Quit date: 02/29/2008    Years since quitting: 11.3  . Smokeless tobacco: Never Used  Substance Use Topics  . Alcohol use: Yes    Alcohol/week: 5.0 standard drinks    Types: 5 Cans of beer per week    Comment: 02/28/18 none  . Drug use: Yes    Types: Marijuana    Comment: 12/13/2017 "qd", 02/28/18 "every now andd then"     Allergies   Patient has no known allergies.   Review of Systems Review of Systems  Constitutional: Negative for chills  and fever.  HENT: Negative for ear pain and sore throat.   Eyes: Negative for pain and visual disturbance.  Respiratory: Negative for cough and shortness of breath.   Cardiovascular: Negative for chest pain and palpitations.  Gastrointestinal: Negative for abdominal pain and vomiting.  Genitourinary: Negative for dysuria and hematuria.  Musculoskeletal: Negative for arthralgias and back pain.  Skin: Negative for color change and rash.  Neurological: Positive for seizures. Negative for dizziness, syncope, weakness, numbness and headaches.  All other systems reviewed and are negative.    Physical Exam Updated Vital Signs BP (!) 155/92   Pulse 95   Temp 97.7 F (36.5 C) (Oral)   Resp (!) 21   Ht 6\' 2"  (1.88 m)   Wt 81.6  kg   SpO2 99%   BMI 23.11 kg/m   Physical Exam Vitals signs and nursing note reviewed.  Constitutional:      General: He is not in acute distress.    Appearance: Normal appearance. He is normal weight. He is not ill-appearing, toxic-appearing or diaphoretic.  HENT:     Head: Normocephalic and atraumatic.     Nose: Nose normal. No congestion or rhinorrhea.     Mouth/Throat:     Mouth: Mucous membranes are moist.     Pharynx: Oropharynx is clear. No oropharyngeal exudate or posterior oropharyngeal erythema.  Eyes:     Extraocular Movements: Extraocular movements intact.     Conjunctiva/sclera: Conjunctivae normal.     Pupils: Pupils are equal, round, and reactive to light.  Neck:     Musculoskeletal: Normal range of motion and neck supple. No neck rigidity or muscular tenderness.  Cardiovascular:     Rate and Rhythm: Normal rate and regular rhythm.     Pulses: Normal pulses.     Heart sounds: Normal heart sounds. No murmur. No friction rub. No gallop.   Pulmonary:     Effort: Pulmonary effort is normal. No respiratory distress.     Breath sounds: Normal breath sounds. No stridor. No wheezing, rhonchi or rales.  Abdominal:     General: Abdomen is flat. There is no distension.     Palpations: Abdomen is soft.     Tenderness: There is no abdominal tenderness. There is no guarding or rebound.  Musculoskeletal: Normal range of motion.        General: No swelling, tenderness, deformity or signs of injury.  Skin:    General: Skin is warm and dry.  Neurological:     General: No focal deficit present.     Mental Status: He is alert. He is disoriented.     Cranial Nerves: No cranial nerve deficit.     Sensory: No sensory deficit.     Motor: No weakness.  Psychiatric:        Behavior: Behavior is agitated and combative.      ED Treatments / Results  Labs (all labs ordered are listed, but only abnormal results are displayed) Labs Reviewed  BASIC METABOLIC PANEL - Abnormal;  Notable for the following components:      Result Value   CO2 12 (*)    Glucose, Bld 134 (*)    Creatinine, Ser 1.58 (*)    GFR calc non Af Amer 52 (*)    Anion gap 21 (*)    All other components within normal limits  CBC WITH DIFFERENTIAL/PLATELET    EKG None  Radiology No results found.  Procedures Procedures (including critical care time)  Medications Ordered in ED Medications  levETIRAcetam (KEPPRA) IVPB 1000 mg/100 mL premix (0 mg Intravenous Stopped 07/02/19 1815)  LORazepam (ATIVAN) injection 2 mg (2 mg Intramuscular Given 07/02/19 1720)  levETIRAcetam (KEPPRA) tablet 500 mg (500 mg Oral Given 07/02/19 2009)     Initial Impression / Assessment and Plan / ED Course  I have reviewed the triage vital signs and the nursing notes.  Pertinent labs & imaging results that were available during my care of the patient were reviewed by me and considered in my medical decision making (see chart for details).        Lady SaucierQuincy Roselli is a 43 y.o. male with history of seizure disorder on Keppra who presents to the ED via EMS complaining of a seizure this afternoon that lasted approximately 2 to 3 minutes and was associated with a postictal phase in which patient was combative.  Patient was given 5 mg of IM Versed by EMS and was given 2 mg of IM Ativan on arrival to the ED for continued agitation and combativeness.  Patient was loaded with 1 g of IV Keppra and was given an additional 500 mg of p.o. Keppra while in the ED.  BMP showed no evidence of electrolyte abnormalities.  CBC was unremarkable.  Patient seizures are likely due to noncompliance with his medication regimen.  Patient was offered a new prescription for his Keppra but reports he has refills available at the pharmacy and plans to pick them up in the morning.  Patient was discharged home in stable condition.   Final Clinical Impressions(s) / ED Diagnoses   Final diagnoses:  Seizure [facility](HCC)    ED Discharge Orders     None       Garry HeaterEames, Kellie Chisolm, MD 07/03/19 0111    Lorre NickAllen, Anthony, MD 07/03/19 1440

## 2019-07-02 NOTE — Discharge Instructions (Signed)
Pick up your seizure medication as soon as possible.

## 2019-07-02 NOTE — ED Notes (Signed)
Both wrist restraints removed as pt is now cooperative and more oriented. Girlfriend at bed side.  Will continue to monitor.

## 2019-07-02 NOTE — ED Triage Notes (Signed)
Patient BIB [facility] due to having a seizure at home around 1600. Full-body seizure lasted about 2-3 minutes. Patient become intensely aggressive and uncooperative on scene post-ictal. EMS had to place patient in 4point restraints. Patient given 5mg  Midazolam on scene.  Patient has been out of his seizure medicine for 3 days.

## 2019-07-02 NOTE — ED Provider Notes (Signed)
I saw and evaluated the patient, reviewed the resident's note and I agree with the findings and plan.  EKG:   43 year old male presents after having a witnessed seizure at home.  Is been noncompliant with his Keppra.  Will get routine labs as well as loaded with Keppra here and likely discharge.   Lacretia Leigh, MD 07/02/19 1729

## 2019-07-25 ENCOUNTER — Other Ambulatory Visit: Payer: Self-pay

## 2019-07-25 ENCOUNTER — Emergency Department (HOSPITAL_COMMUNITY)
Admission: EM | Admit: 2019-07-25 | Discharge: 2019-07-25 | Disposition: A | Discharge status: Home / Self Care | Payer: BC Managed Care – PPO | Attending: Emergency Medicine | Admitting: Emergency Medicine

## 2019-07-25 DIAGNOSIS — Z87891 Personal history of nicotine dependence: Secondary | ICD-10-CM | POA: Diagnosis not present

## 2019-07-25 DIAGNOSIS — R569 Unspecified convulsions: Secondary | ICD-10-CM | POA: Diagnosis present

## 2019-07-25 DIAGNOSIS — G40909 Epilepsy, unspecified, not intractable, without status epilepticus: Secondary | ICD-10-CM | POA: Diagnosis not present

## 2019-07-25 LAB — CBC WITH DIFFERENTIAL/PLATELET
Abs Immature Granulocytes: 0.02 10*3/uL (ref 0.00–0.07)
Basophils Absolute: 0.1 10*3/uL (ref 0.0–0.1)
Basophils Relative: 1 %
Eosinophils Absolute: 0.1 10*3/uL (ref 0.0–0.5)
Eosinophils Relative: 2 %
HCT: 41.4 % (ref 39.0–52.0)
Hemoglobin: 13.7 g/dL (ref 13.0–17.0)
Immature Granulocytes: 0 %
Lymphocytes Relative: 22 %
Lymphs Abs: 1.7 10*3/uL (ref 0.7–4.0)
MCH: 31.4 pg (ref 26.0–34.0)
MCHC: 33.1 g/dL (ref 30.0–36.0)
MCV: 94.7 fL (ref 80.0–100.0)
Monocytes Absolute: 0.6 10*3/uL (ref 0.1–1.0)
Monocytes Relative: 7 %
Neutro Abs: 5.3 10*3/uL (ref 1.7–7.7)
Neutrophils Relative %: 68 %
Platelets: 292 10*3/uL (ref 150–400)
RBC: 4.37 MIL/uL (ref 4.22–5.81)
RDW: 13.2 % (ref 11.5–15.5)
WBC: 7.8 10*3/uL (ref 4.0–10.5)
nRBC: 0 % (ref 0.0–0.2)

## 2019-07-25 LAB — RAPID URINE DRUG SCREEN, HOSP PERFORMED
Amphetamines: NOT DETECTED
Barbiturates: NOT DETECTED
Benzodiazepines: NOT DETECTED
Cocaine: NOT DETECTED
Opiates: NOT DETECTED
Tetrahydrocannabinol: POSITIVE — AB

## 2019-07-25 LAB — BASIC METABOLIC PANEL
Anion gap: 15 (ref 5–15)
BUN: 11 mg/dL (ref 6–20)
CO2: 21 mmol/L — ABNORMAL LOW (ref 22–32)
Calcium: 9.2 mg/dL (ref 8.9–10.3)
Chloride: 103 mmol/L (ref 98–111)
Creatinine, Ser: 1.44 mg/dL — ABNORMAL HIGH (ref 0.61–1.24)
GFR calc Af Amer: >60 mL/min (ref >60)
GFR calc non Af Amer: 59 mL/min — ABNORMAL LOW (ref >60)
Glucose, Bld: 143 mg/dL — ABNORMAL HIGH (ref 70–99)
Potassium: 3.2 mmol/L — ABNORMAL LOW (ref 3.5–5.1)
Sodium: 139 mmol/L (ref 135–145)

## 2019-07-25 MED ORDER — LEVETIRACETAM IN NACL 1000 MG/100ML IV SOLN
1000.0000 mg | Freq: Once | INTRAVENOUS | Status: AC
  Administered 2019-07-25: 10:00:00 1000 mg via INTRAVENOUS
  Filled 2019-07-25: qty 100

## 2019-07-25 MED ORDER — LEVETIRACETAM 750 MG PO TABS: 750.0000 mg | ORAL_TABLET | Freq: Two times a day (BID) | ORAL | 0 refills | Status: DC

## 2019-07-25 MED ORDER — SODIUM CHLORIDE 0.9 % IV BOLUS
1000.0000 mL | Freq: Once | INTRAVENOUS | Status: AC
  Administered 2019-07-25: 1000 mL via INTRAVENOUS

## 2019-07-25 NOTE — ED Notes (Signed)
Patient verbalized understanding of discharge instructions and denies any further needs or questions at this time. VS stable. Patient ambulatory with steady gait.  

## 2019-07-25 NOTE — ED Notes (Signed)
Seizure pads placed on pt's bed rails. Pt also given a urinal and asked to provide a urine sample. Pt stated that he will try later.

## 2019-07-25 NOTE — ED Provider Notes (Signed)
[facility] Provider Note   CSN: [id number] Arrival date & time: 07/25/19  [id number]     History   Chief Complaint Chief Complaint  Patient presents with  . Seizures    HPI Lady SaucierQuincy Grider is a 43 y.o. male past medical history of seizure, substance abuse who presents via EMS for evaluation of seizure.  Patient reports that he worked all night on the night shift and came home to go to sleep at about 5:30 AM.  He states that the next thing he remembers is waking up in the ambulance.  EMS was called after girlfriend noticed that patient started having full tonic-clonic seizing activity.  On EMS arrival, patient was postictal.  He also had right-sided intraoral trauma noted.  Bleeding controlled.  Patient also endorses that he had some urinary incontinence.  On ED arrival, he is alert and oriented.  He states that he is on Keppra for management of seizures and states he has been compliant with his medications and has not skipped any doses.  He does report that he uses marijuana and last use yesterday.  No other illicit drug use.  He reports that he drinks about 1 beer a day and states that he last had a Heineken beer at 4 PM yesterday.  Patient states he has not had any recent sicknesses.  Denies any fevers, cough, congestion.  Patient denies any chest pain, abdominal pain, numbness/weakness of his arms or legs, nausea/vomiting.    The history is provided by the patient.    Past Medical History:  Diagnosis Date  . Seizure (HCC) 1997; 12/13/2017 X 2  . Seizures (HCC)   . Substance abuse [facility](HCC)     Patient Active Problem List   Diagnosis Date Noted  . Seizure (HCC) 12/13/2017  . Rhabdomyolysis 12/13/2017  . Hypokalemia 12/13/2017    Past Surgical History:  Procedure Laterality Date  . NO PAST SURGERIES          Home Medications    Prior to Admission medications   Medication Sig Start Date End Date Taking? Authorizing Provider  acetaminophen  (TYLENOL) 500 MG tablet Take 1,000 mg by mouth every 6 (six) hours as needed for mild pain.    [provider]  levETIRAcetam (KEPPRA) 750 MG tablet Take 1 tablet (750 mg total) by mouth 2 (two) times daily. 07/25/19 08/24/19  Graciella FreerLayden, Bode Pieper A, PA-C  potassium chloride (K-DUR) 10 MEQ tablet Take 1 tablet (10 mEq total) by mouth daily for 5 days. Patient not taking: Reported on 04/30/2019 01/14/19 01/19/19  Arlyn DunningMcLean, Kelly A, PA-C    Family History Family History  Problem Relation Age of Onset  . Hypertension Mother   . Hypertension Father     Social History Social History   Tobacco Use  . Smoking status: Former Smoker    Packs/day: 1.00    Years: 10.00    Pack years: 10.00    Quit date: 02/29/2008    Years since quitting: 11.4  . Smokeless tobacco: Never Used  Substance Use Topics  . Alcohol use: Yes    Alcohol/week: 5.0 standard drinks    Types: 5 Cans of beer per week    Comment: 02/28/18 none  . Drug use: Yes    Types: Marijuana    Comment: 12/13/2017 "qd", 02/28/18 "every now andd then"     Allergies   Patient has no known allergies.   Review of Systems Review of Systems  Constitutional: Negative for fever.  Respiratory: Negative  for cough and shortness of breath.   Cardiovascular: Negative for chest pain.  Gastrointestinal: Negative for abdominal pain, nausea and vomiting.  Genitourinary: Negative for dysuria and hematuria.  Skin: Positive for wound.  Neurological: Positive for seizures. Negative for headaches.  All other systems reviewed and are negative.    Physical Exam Updated Vital Signs BP 131/75   Pulse (!) 58   Temp 98.4 F (36.9 C) (Oral)   Resp 13   SpO2 99%   Physical Exam Vitals signs and nursing note reviewed.  Constitutional:      Appearance: Normal appearance. He is well-developed.  HENT:     Head: Normocephalic and atraumatic.     Mouth/Throat:     Comments: Small right-sided tongue lack noted.  Bleeding controlled. Eyes:      General: Lids are normal.     Conjunctiva/sclera: Conjunctivae normal.     Pupils: Pupils are equal, round, and reactive to light.     Comments: PERRL. EOMs intact. No nystagmus. No neglect.   Neck:     Musculoskeletal: Full passive range of motion without pain.  Cardiovascular:     Rate and Rhythm: Normal rate and regular rhythm.     Pulses: Normal pulses.     Heart sounds: Normal heart sounds. No murmur. No friction rub. No gallop.   Pulmonary:     Effort: Pulmonary effort is normal.     Breath sounds: Normal breath sounds.  Abdominal:     Palpations: Abdomen is soft. Abdomen is not rigid.     Tenderness: There is no abdominal tenderness. There is no guarding.     Comments: Abdomen is soft, non-distended, non-tender. No rigidity, No guarding. No peritoneal signs.  Musculoskeletal: Normal range of motion.  Skin:    General: Skin is warm and dry.     Capillary Refill: Capillary refill takes less than 2 seconds.  Neurological:     Mental Status: He is alert and oriented to person, place, and time.     Comments: Cranial nerves III-XII intact Follows commands, Moves all extremities  5/5 strength to BUE and BLE  Sensation intact throughout all major nerve distributions Normal coorindation No slurred speech. No facial droop.   Psychiatric:        Speech: Speech normal.      ED Treatments / Results  Labs (all labs ordered are listed, but only abnormal results are displayed) Labs Reviewed  BASIC METABOLIC PANEL - Abnormal; Notable for the following components:      Result Value   Potassium 3.2 (*)    CO2 21 (*)    Glucose, Bld 143 (*)    Creatinine, Ser 1.44 (*)    GFR calc non Af Amer 59 (*)    All other components within normal limits  RAPID URINE DRUG SCREEN, HOSP PERFORMED - Abnormal; Notable for the following components:   Tetrahydrocannabinol POSITIVE (*)    All other components within normal limits  CBC WITH DIFFERENTIAL/PLATELET  LEVETIRACETAM LEVEL    EKG EKG  Interpretation  Date/Time:  Wednesday July 25 2019 08:49:58 EDT Ventricular Rate:  98 PR Interval:    QRS Duration: 97 QT Interval:  366 QTC Calculation: 468 R Axis:   122 Text Interpretation:  Sinus rhythm RAE, consider biatrial enlargement Right ventricular hypertrophy ST elev, probable normal early repol pattern No significant change since last tracing Confirmed by Frederick Peers [phone]) on 07/25/2019 8:56:04 AM   Radiology No results found.  Procedures Procedures (including critical care time)  Medications  Ordered in ED Medications  sodium chloride 0.9 % bolus 1,000 mL (0 mLs Intravenous Stopped 07/25/19 1024)  levETIRAcetam (KEPPRA) IVPB 1000 mg/100 mL premix (0 mg Intravenous Stopped 07/25/19 1039)     Initial Impression / Assessment and Plan / ED Course  I have reviewed the triage vital signs and the nursing notes.  Pertinent labs & imaging results that were available during my care of the patient were reviewed by me and considered in my medical decision making (see chart for details).        43 year old male past medical history of substance abuse, seizures who presents via EMS for seizure activity that occurred this morning.  He is on Keppra and states he has not missed any doses.  Does report marijuana use yesterday.  No other illicit drug use.  Does endorse daily alcohol use.  On initial ED arrival, he is afebrile, sitting comfortably on examination table and is nontoxic-appearing.  Vitals stable.  No neuro deficits noted on exam, he is alert and oriented x3 and appears back to baseline.  Plan to check labs, Keppra level.  Review of records show that he was first diagnosed with seizures in January 2018.  At that time, he was admitted with negative MRI negative EEG.  It was suspected that his seizures were secondary to marijuana use.  BMP shows hypokalemia of 3.2.  Oral potassium given.  He has slight bump in creatinine of 1.44.  Patient given fluids.  UDS is positive  for marijuana.  CBC without any significant leukocytosis or anemia.  Discussed with Dr. Leonel Ramsay (neuro).  Recommends increasing patient's medication dose to 750 mg twice daily.  Discussed results with patient.  He has not had any more seizing activity here in the ED.  He currently denies any complaints at this time.  He is answering questions appropriately and is alert and oriented x3.  Instructed him that we will be changing his Keppra dose to 750 mg twice daily.  Instructed him to follow-up with his neurology office.  Patient given seizure precautions. At this time, patient exhibits no emergent life-threatening condition that require further evaluation in ED or admission. Patient had ample opportunity for questions and discussion. All patient's questions were answered with full understanding. Strict return precautions discussed. Patient expresses understanding and agreement to plan.   Portions of this note were generated with L[employer]. Dictation errors may occur despite best attempts at proofreading.  Final Clinical Impressions(s) / ED Diagnoses   Final diagnoses:  Seizure [facility])    ED Discharge Orders         Ordered    levETIRAcetam (KEPPRA) 750 MG tablet  2 times daily     07/25/19 1230           Desma Mcgregor 07/25/19 1351    Little, Wenda Overland, MD 07/29/19 1657

## 2019-07-25 NOTE — Discharge Instructions (Signed)
As we discussed, we will be changing your medication.  You will now take 750 mg of Keppra twice a day.  Follow-up with your neurologist.  Call and arrange for an appointment next 1 to 2 weeks.  You should refrain from driving, swimming alone, showering alone until you are seen by the neurologist.  Return the emergency department for any repeat seizure activity, chest pain, difficulty breathing, vomiting or any other worsening concerning symptoms.

## 2019-07-25 NOTE — ED Triage Notes (Signed)
Patient in via [facility] from home after his girlfriend witnessed him having a seizure while in bed, lasting approximately 10 minutes, whole body shaking. Patient has had seizures for about 2 years - states last one was 3 weeks ago, compliant with Keppra. 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 Injury to R side of tongue noted, bleeding controlled. Patient also endorses urinary incontinence. Was post-ictal with EMS. A&O x 4, denies pain.   EMS VS: 142/98, HR 102, 100% RA, 97.53F temporal, CBG 89.

## 2019-07-25 NOTE — ED Notes (Signed)
Patient provided scrubs to change into. Also provided ice water for PO fluid challenge, tolerating well. Ambulatory in room with steady gait.

## 2019-07-26 LAB — LEVETIRACETAM LEVEL: Levetiracetam Lvl: 7.6 ug/mL — ABNORMAL LOW (ref 10.0–40.0)

## 2019-09-07 ENCOUNTER — Other Ambulatory Visit: Payer: Self-pay

## 2019-09-07 ENCOUNTER — Encounter (HOSPITAL_COMMUNITY): Payer: Self-pay | Admitting: Emergency Medicine

## 2019-09-07 ENCOUNTER — Emergency Department (HOSPITAL_COMMUNITY)
Admission: EM | Admit: 2019-09-07 | Discharge: 2019-09-07 | Disposition: A | Discharge status: Home / Self Care | Payer: BC Managed Care – PPO | Attending: Emergency Medicine | Admitting: Emergency Medicine

## 2019-09-07 DIAGNOSIS — R569 Unspecified convulsions: Secondary | ICD-10-CM | POA: Insufficient documentation

## 2019-09-07 DIAGNOSIS — Z79899 Other long term (current) drug therapy: Secondary | ICD-10-CM | POA: Diagnosis not present

## 2019-09-07 DIAGNOSIS — Z87891 Personal history of nicotine dependence: Secondary | ICD-10-CM | POA: Diagnosis not present

## 2019-09-07 LAB — I-STAT CHEM 8, ED
BUN: 7 mg/dL (ref 6–20)
Calcium, Ion: 1.19 mmol/L (ref 1.15–1.40)
Chloride: 106 mmol/L (ref 98–111)
Creatinine, Ser: 1.3 mg/dL — ABNORMAL HIGH (ref 0.61–1.24)
Glucose, Bld: 171 mg/dL — ABNORMAL HIGH (ref 70–99)
HCT: 42 % (ref 39.0–52.0)
Hemoglobin: 14.3 g/dL (ref 13.0–17.0)
Potassium: 3 mmol/L — ABNORMAL LOW (ref 3.5–5.1)
Sodium: 141 mmol/L (ref 135–145)
TCO2: 21 mmol/L — ABNORMAL LOW (ref 22–32)

## 2019-09-07 MED ORDER — LEVETIRACETAM 500 MG PO TABS
1000.0000 mg | ORAL_TABLET | Freq: Once | ORAL | Status: AC
  Administered 2019-09-07: 12:00:00 1000 mg via ORAL
  Filled 2019-09-07: qty 2

## 2019-09-07 MED ORDER — LEVETIRACETAM 750 MG PO TABS: 750.0000 mg | ORAL_TABLET | Freq: Two times a day (BID) | ORAL | 0 refills | Status: DC

## 2019-09-07 NOTE — Discharge Instructions (Signed)
Please increase your keppra to 750 mg two times daily Follow up with your neurologist as scheduled

## 2019-09-07 NOTE — ED Notes (Signed)
Patient verbalizes understanding of discharge instructions. Opportunity for questioning and answers were provided. Armband removed by staff, pt discharged from ED.  

## 2019-09-07 NOTE — ED Triage Notes (Signed)
Pt arrives to ED from home with complaints of a grand mal seizure witnessed by the patients mother. The seizure was in the patients bedroom and it lasted less than one minute. EMS reports trauma to the patients tongue with bleeding controlled. Pt was post ictal and was combative towards EMS staff, pt was given 5mg  versed IM. Patient unaware on seizure at this time.

## 2019-09-07 NOTE — ED Provider Notes (Signed)
[facility] EMERGENCY DEPARTMENT Provider Note   CSN: [id number] Arrival date & time: 09/07/19  1111     History   Chief Complaint No chief complaint on file.   HPI Samrat Hayward is a 43 y.o. male.     HPI  Level 5 caveat secondary to post ictal state 43 year old male known history of seizure disorder presents today after grand mal seizure.  EMS was called by family.  They reported his typical grand mal seizure.  By the time EMS arrived, patient was no longer having seizure activity but did appear to be confused and agitated consistent with postictal state.  Secondary to this EMS gave him 5 mg of midazolam.  He is awake and conversant but still appears to be slightly confused.  He endorses that he has been taking his medication as prescribed, but had not had his am dose today.   Past Medical History:  Diagnosis Date  . Seizure (HCC) 1997; 12/13/2017 X 2  . Seizures (HCC)   . Substance abuse [facility])     Patient Active Problem List   Diagnosis Date Noted  . Seizure (HCC) 12/13/2017  . Rhabdomyolysis 12/13/2017  . Hypokalemia 12/13/2017    Past Surgical History:  Procedure Laterality Date  . NO PAST SURGERIES          Home Medications    Prior to Admission medications   Medication Sig Start Date End Date Taking? Authorizing Provider  acetaminophen (TYLENOL) 500 MG tablet Take 1,000 mg by mouth every 6 (six) hours as needed for mild pain.    [provider]  levETIRAcetam (KEPPRA) 750 MG tablet Take 1 tablet (750 mg total) by mouth 2 (two) times daily. 07/25/19 08/24/19  Graciella Freer A, PA-C  potassium chloride (K-DUR) 10 MEQ tablet Take 1 tablet (10 mEq total) by mouth daily for 5 days. Patient not taking: Reported on 04/30/2019 01/14/19 01/19/19  Arlyn Dunning PA-C    Family History Family History  Problem Relation Age of Onset  . Hypertension Mother   . Hypertension Father     Social History Social History   Tobacco Use   . Smoking status: Former Smoker    Packs/day: 1.00    Years: 10.00    Pack years: 10.00    Quit date: 02/29/2008    Years since quitting: 11.5  . Smokeless tobacco: Never Used  Substance Use Topics  . Alcohol use: Yes    Alcohol/week: 5.0 standard drinks    Types: 5 Cans of beer per week    Comment: 02/28/18 none  . Drug use: Yes    Types: Marijuana    Comment: 12/13/2017 "qd", 02/28/18 "every now andd then"     Allergies   Patient has no known allergies.   Review of Systems Review of Systems  All other systems reviewed and are negative.    Physical Exam Updated Vital Signs There were no vitals taken for this visit.  Physical Exam Vitals signs and nursing note reviewed.  Constitutional:      Appearance: He is well-developed.  HENT:     Head: Normocephalic and atraumatic.     Right Ear: External ear normal.     Left Ear: External ear normal.     Nose: Nose normal.     Mouth/Throat:     Comments: Tongue abrasion Eyes:     Conjunctiva/sclera: Conjunctivae normal.     Pupils: Pupils are equal, round, and reactive to light.  Neck:  Musculoskeletal: Normal range of motion and neck supple.  Cardiovascular:     Rate and Rhythm: Normal rate and regular rhythm.     Heart sounds: Normal heart sounds.  Pulmonary:     Effort: Pulmonary effort is normal. No respiratory distress.     Breath sounds: Normal breath sounds. No wheezing.  Chest:     Chest wall: No tenderness.  Abdominal:     General: Bowel sounds are normal. There is no distension.     Palpations: Abdomen is soft. There is no mass.     Tenderness: There is no abdominal tenderness. There is no guarding.  Musculoskeletal: Normal range of motion.  Skin:    General: Skin is warm and dry.  Neurological:     Mental Status: He is alert and oriented to person, place, and time.     Motor: No abnormal muscle tone.     Coordination: Coordination normal.     Deep Tendon Reflexes: Reflexes are normal and symmetric.   Psychiatric:        Behavior: Behavior normal.        Thought Content: Thought content normal.        Judgment: Judgment normal.      ED Treatments / Results  Labs (all labs ordered are listed, but only abnormal results are displayed) Labs Reviewed  I-STAT CHEM 8, ED    EKG None  Radiology No results found.  Procedures Procedures (including critical care time)  Medications Ordered in ED Medications - No data to display   Initial Impression / Assessment and Plan / ED Course  I have reviewed the triage vital signs and the nursing notes.  Pertinent labs & imaging results that were available during my care of the patient were reviewed by me and considered in my medical decision making (see chart for details).          Final Clinical Impressions(s) / ED Diagnoses   Final diagnoses:  Seizure [facility])    ED Discharge Orders         Ordered    levETIRAcetam (KEPPRA) 750 MG tablet  2 times daily     09/07/19 1153           Pattricia Boss, MD 09/10/19 1238

## 2019-10-07 ENCOUNTER — Other Ambulatory Visit: Payer: Self-pay

## 2019-10-07 ENCOUNTER — Emergency Department (HOSPITAL_COMMUNITY)
Admission: EM | Admit: 2019-10-07 | Discharge: 2019-10-07 | Disposition: A | Discharge status: Home / Self Care | Payer: BC Managed Care – PPO | Attending: Emergency Medicine | Admitting: Emergency Medicine

## 2019-10-07 ENCOUNTER — Encounter (HOSPITAL_COMMUNITY): Payer: Self-pay

## 2019-10-07 DIAGNOSIS — Z87891 Personal history of nicotine dependence: Secondary | ICD-10-CM | POA: Diagnosis not present

## 2019-10-07 DIAGNOSIS — Z760 Encounter for issue of repeat prescription: Secondary | ICD-10-CM | POA: Diagnosis not present

## 2019-10-07 DIAGNOSIS — G40909 Epilepsy, unspecified, not intractable, without status epilepticus: Secondary | ICD-10-CM | POA: Insufficient documentation

## 2019-10-07 DIAGNOSIS — Z79899 Other long term (current) drug therapy: Secondary | ICD-10-CM | POA: Diagnosis not present

## 2019-10-07 MED ORDER — LEVETIRACETAM 750 MG PO TABS
750.0000 mg | ORAL_TABLET | Freq: Once | ORAL | Status: AC
  Administered 2019-10-07: 22:00:00 750 mg via ORAL
  Filled 2019-10-07: qty 1

## 2019-10-07 MED ORDER — LEVETIRACETAM 750 MG PO TABS: 750.0000 mg | ORAL_TABLET | Freq: Two times a day (BID) | ORAL | 0 refills | Status: DC

## 2019-10-07 NOTE — ED Notes (Signed)
Patient verbalizes understanding of discharge instructions. Opportunity for questioning and answers were provided. Armband removed by staff, pt discharged from ED.  

## 2019-10-07 NOTE — ED Triage Notes (Signed)
Patient here for keppra refill, took last pill this am

## 2019-10-07 NOTE — Discharge Instructions (Addendum)
Please take your medications, as prescribed.  Follow-up at your scheduled neurology appointment.   Return to the ED should you develop any seizure activity or other new or worsening symptoms.

## 2019-10-07 NOTE — ED Provider Notes (Signed)
[place] EMERGENCY DEPARTMENT Provider Note   CSN: [id number] Arrival date & time: 10/07/19  1900     History   Chief Complaint No chief complaint on file.   HPI [facility] is a 43 y.o. male with a history of seizure disorder on Keppra who presents to the ED for a Keppra refill after having taken his last pill this morning.  Patient was last prescribed 60 pills 750 mg Keppra twice daily on 09/07/2019.  He has an appointment with Dr. Andrey Spearman with [facility] on 10/16/2019 at 11 AM and he is looking forward to the appointment.  He reports that he has not had any seizures since starting 750 mg Keppra twice daily.  He denies any and all symptoms at this time.     HPI  Past Medical History:  Diagnosis Date  . Seizure ([place]) 1997; 12/13/2017 X 2  . Seizures ([place])   . Substance abuse [facility])     Patient Active Problem List   Diagnosis Date Noted  . Seizure ([place]) 12/13/2017  . Rhabdomyolysis 12/13/2017  . Hypokalemia 12/13/2017    Past Surgical History:  Procedure Laterality Date  . NO PAST SURGERIES          Home Medications    Prior to Admission medications   Medication Sig Start Date End Date Taking? Authorizing Provider  acetaminophen (TYLENOL) 500 MG tablet Take 1,000 mg by mouth every 6 (six) hours as needed for mild pain.    [provider]  levETIRAcetam (KEPPRA) 750 MG tablet Take 1 tablet (750 mg total) by mouth 2 (two) times daily for 15 days. 10/07/19 10/22/19  Corena Herter, PA-C  potassium chloride (K-DUR) 10 MEQ tablet Take 1 tablet (10 mEq total) by mouth daily for 5 days. Patient not taking: Reported on 04/30/2019 01/14/19 01/19/19  Alveria Apley PA-C    Family History Family History  Problem Relation Age of Onset  . Hypertension Mother   . Hypertension Father     Social History Social History   Tobacco Use  . Smoking status: Former Smoker    Packs/day: 1.00    Years: 10.00    Pack  years: 10.00    Quit date: 02/29/2008    Years since quitting: 11.6  . Smokeless tobacco: Never Used  Substance Use Topics  . Alcohol use: Yes    Alcohol/week: 5.0 standard drinks    Types: 5 Cans of beer per week    Comment: 02/28/18 none  . Drug use: Yes    Types: Marijuana    Comment: 12/13/2017 "qd", 02/28/18 "every now andd then"     Allergies   Patient has no known allergies.   Review of Systems Review of Systems  Constitutional: Negative for fever.  Neurological: Negative for dizziness and seizures.     Physical Exam Updated Vital Signs BP (!) 144/103 (BP Location: Right Arm)   Pulse 65   Temp 98.1 F (36.7 C) (Oral)   Resp 18   SpO2 100%   Physical Exam Vitals signs and nursing note reviewed. Exam conducted with a chaperone present.  Constitutional:      Appearance: Normal appearance.  HENT:     Head: Normocephalic and atraumatic.  Eyes:     General: No scleral icterus.    Conjunctiva/sclera: Conjunctivae normal.  Cardiovascular:     Rate and Rhythm: Normal rate.     Pulses: Normal pulses.  Pulmonary:     Effort: Pulmonary effort is normal.  Breath sounds: Normal breath sounds.  Skin:    General: Skin is dry.  Neurological:     Mental Status: He is alert and oriented to person, place, and time.     GCS: GCS eye subscore is 4. GCS verbal subscore is 5. GCS motor subscore is 6.  Psychiatric:        Mood and Affect: Mood normal.        Behavior: Behavior normal.        Thought Content: Thought content normal.      ED Treatments / Results  Labs (all labs ordered are listed, but only abnormal results are displayed) Labs Reviewed - No data to display  EKG None  Radiology No results found.  Procedures Procedures (including critical care time)  Medications Ordered in ED Medications  levETIRAcetam (KEPPRA) tablet 750 mg (750 mg Oral Given 10/07/19 2219)     Initial Impression / Assessment and Plan / ED Course  I have reviewed the triage  vital signs and the nursing notes.  Pertinent labs & imaging results that were available during my care of the patient were reviewed by me and considered in my medical decision making (see chart for details).        Patient is here for medication refill and he has a plan for outpatient follow-up with neurology services.  He anticipates that this is the last time that he will need to return to the ED for medication refill given his upcoming appointment.  He denies any and all symptoms at this time and is feeling well with no complaints.  No recent seizures or changes in his overall health status.  We will refill his Keppra prescription x2 weeks to allow for him to last until his neurology appointment, with some buffer.  Patient voiced understanding and is agreement with plan.   Final Clinical Impressions(s) / ED Diagnoses   Final diagnoses:  Encounter for medication refill    ED Discharge Orders         Ordered    levETIRAcetam (KEPPRA) 750 MG tablet  2 times daily     10/07/19 2218           Lorelee New, PA-C 10/07/19 2221    Sabas Sous, MD 10/08/19 2258

## 2019-10-13 ENCOUNTER — Encounter (HOSPITAL_COMMUNITY): Payer: Self-pay

## 2019-10-13 ENCOUNTER — Emergency Department (HOSPITAL_COMMUNITY)
Admission: EM | Admit: 2019-10-13 | Discharge: 2019-10-13 | Disposition: A | Discharge status: Home / Self Care | Payer: BC Managed Care – PPO | Attending: Emergency Medicine | Admitting: Emergency Medicine

## 2019-10-13 ENCOUNTER — Other Ambulatory Visit: Payer: Self-pay

## 2019-10-13 DIAGNOSIS — R000 Tachycardia, unspecified: Secondary | ICD-10-CM | POA: Insufficient documentation

## 2019-10-13 DIAGNOSIS — Z8669 Personal history of other diseases of the nervous system and sense organs: Secondary | ICD-10-CM | POA: Insufficient documentation

## 2019-10-13 DIAGNOSIS — E876 Hypokalemia: Secondary | ICD-10-CM | POA: Diagnosis not present

## 2019-10-13 DIAGNOSIS — R569 Unspecified convulsions: Secondary | ICD-10-CM | POA: Diagnosis present

## 2019-10-13 DIAGNOSIS — Z87891 Personal history of nicotine dependence: Secondary | ICD-10-CM | POA: Diagnosis not present

## 2019-10-13 DIAGNOSIS — F1290 Cannabis use, unspecified, uncomplicated: Secondary | ICD-10-CM | POA: Diagnosis not present

## 2019-10-13 DIAGNOSIS — Z79899 Other long term (current) drug therapy: Secondary | ICD-10-CM | POA: Diagnosis not present

## 2019-10-13 LAB — RAPID URINE DRUG SCREEN, HOSP PERFORMED
Amphetamines: NOT DETECTED
Barbiturates: NOT DETECTED
Benzodiazepines: POSITIVE — AB
Cocaine: NOT DETECTED
Opiates: NOT DETECTED
Tetrahydrocannabinol: POSITIVE — AB

## 2019-10-13 LAB — CBC WITH DIFFERENTIAL/PLATELET
Abs Immature Granulocytes: 0.02 10*3/uL (ref 0.00–0.07)
Basophils Absolute: 0 10*3/uL (ref 0.0–0.1)
Basophils Relative: 0 %
Eosinophils Absolute: 0.1 10*3/uL (ref 0.0–0.5)
Eosinophils Relative: 1 %
HCT: 39.2 % (ref 39.0–52.0)
Hemoglobin: 12.9 g/dL — ABNORMAL LOW (ref 13.0–17.0)
Immature Granulocytes: 0 %
Lymphocytes Relative: 16 %
Lymphs Abs: 1.5 10*3/uL (ref 0.7–4.0)
MCH: 30.8 pg (ref 26.0–34.0)
MCHC: 32.9 g/dL (ref 30.0–36.0)
MCV: 93.6 fL (ref 80.0–100.0)
Monocytes Absolute: 0.6 10*3/uL (ref 0.1–1.0)
Monocytes Relative: 7 %
Neutro Abs: 7.2 10*3/uL (ref 1.7–7.7)
Neutrophils Relative %: 76 %
Platelets: 330 10*3/uL (ref 150–400)
RBC: 4.19 MIL/uL — ABNORMAL LOW (ref 4.22–5.81)
RDW: 13.2 % (ref 11.5–15.5)
WBC: 9.4 10*3/uL (ref 4.0–10.5)
nRBC: 0 % (ref 0.0–0.2)

## 2019-10-13 LAB — URINALYSIS, ROUTINE W REFLEX MICROSCOPIC
Bacteria, UA: NONE SEEN
Bilirubin Urine: NEGATIVE
Glucose, UA: NEGATIVE mg/dL
Ketones, ur: NEGATIVE mg/dL
Leukocytes,Ua: NEGATIVE
Nitrite: NEGATIVE
Protein, ur: 30 mg/dL — AB
Specific Gravity, Urine: 1.011 (ref 1.005–1.030)
pH: 6 (ref 5.0–8.0)

## 2019-10-13 LAB — COMPREHENSIVE METABOLIC PANEL
ALT: 27 U/L (ref 0–44)
AST: 42 U/L — ABNORMAL HIGH (ref 15–41)
Albumin: 4.4 g/dL (ref 3.5–5.0)
Alkaline Phosphatase: 76 U/L (ref 38–126)
Anion gap: 12 (ref 5–15)
BUN: 8 mg/dL (ref 6–20)
CO2: 21 mmol/L — ABNORMAL LOW (ref 22–32)
Calcium: 9.1 mg/dL (ref 8.9–10.3)
Chloride: 105 mmol/L (ref 98–111)
Creatinine, Ser: 1.2 mg/dL (ref 0.61–1.24)
GFR calc Af Amer: >60 mL/min (ref >60)
GFR calc non Af Amer: >60 mL/min (ref >60)
Glucose, Bld: 151 mg/dL — ABNORMAL HIGH (ref 70–99)
Potassium: 3 mmol/L — ABNORMAL LOW (ref 3.5–5.1)
Sodium: 138 mmol/L (ref 135–145)
Total Bilirubin: 1.1 mg/dL (ref 0.3–1.2)
Total Protein: 6.3 g/dL — ABNORMAL LOW (ref 6.5–8.1)

## 2019-10-13 MED ORDER — SODIUM CHLORIDE 0.9 % IV BOLUS
1000.0000 mL | Freq: Once | INTRAVENOUS | Status: AC
  Administered 2019-10-13: 10:00:00 1000 mL via INTRAVENOUS

## 2019-10-13 MED ORDER — POTASSIUM CHLORIDE CRYS ER 20 MEQ PO TBCR
40.0000 meq | EXTENDED_RELEASE_TABLET | Freq: Once | ORAL | Status: AC
  Administered 2019-10-13: 40 meq via ORAL
  Filled 2019-10-13: qty 2

## 2019-10-13 MED ORDER — LORAZEPAM 2 MG/ML IJ SOLN
2.0000 mg | Freq: Once | INTRAMUSCULAR | Status: AC
  Administered 2019-10-13: 2 mg via INTRAMUSCULAR
  Filled 2019-10-13: qty 1

## 2019-10-13 MED ORDER — LEVETIRACETAM 750 MG PO TABS
750.0000 mg | ORAL_TABLET | Freq: Once | ORAL | Status: AC
  Administered 2019-10-13: 750 mg via ORAL
  Filled 2019-10-13: qty 1

## 2019-10-13 NOTE — ED Notes (Signed)
Patient is alert oriented to place name and nov. However is constantly trying to get out of bed. Reoriented. Multiple time unable to get a blood pressure at present will not lay still continuely is moving arms.

## 2019-10-13 NOTE — ED Notes (Signed)
Much calmer resting on stretcher sleeping at present.

## 2019-10-13 NOTE — ED Notes (Signed)
Patient much calmer

## 2019-10-13 NOTE — Discharge Instructions (Signed)
Your history and exam today are consistent with a seizure leading to your altered mental status and agitation.  After fluids and monitoring, you return back to her normal mental status and your work-up was only concerning for low potassium which is chronic for you.  We orally replaced her potassium and hydrated you.  As you are feeling better and had no further seizures during several hours of monitoring, we feel you are safe for discharge home to follow-up with your neurology team in 2 days.  Please go to that appointment and if any symptoms change or worsen, please return to the nearest emergency department.  We offered further imaging and neurology consultation given this breakthrough seizure but together decided since your appointment is in several days you are safe to go home.

## 2019-10-13 NOTE — ED Notes (Signed)
Ginger Ale given to drink patient is alert oriented. Family at bedside
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
## 2019-10-14 LAB — URINE CULTURE: Culture: NO GROWTH

## 2019-10-16 ENCOUNTER — Other Ambulatory Visit: Payer: Self-pay

## 2019-10-16 ENCOUNTER — Ambulatory Visit: Payer: BC Managed Care – PPO | Admitting: Diagnostic Neuroimaging

## 2019-10-16 ENCOUNTER — Encounter: Payer: Self-pay | Admitting: Diagnostic Neuroimaging

## 2019-10-16 VITALS — BP 118/72 | HR 68 | Temp 97.7°F | Ht 74.0 in | Wt 177.6 lb

## 2019-10-16 DIAGNOSIS — G40909 Epilepsy, unspecified, not intractable, without status epilepticus: Secondary | ICD-10-CM

## 2019-10-16 LAB — LEVETIRACETAM LEVEL: Levetiracetam Lvl: 9 ug/mL — ABNORMAL LOW (ref 10.0–40.0)

## 2019-10-16 MED ORDER — LEVETIRACETAM 750 MG PO TABS: 1500.0000 mg | ORAL_TABLET | Freq: Two times a day (BID) | ORAL | 4 refills | Status: DC

## 2019-10-16 NOTE — Patient Instructions (Addendum)
SEIZURE DISORDER  - increase levetiracetam to 1500mg  twice a day   - According to [place] law, you can not drive unless you are seizure / syncope free for at least 6 months and under physician's care.   - Please maintain precautions. Do not participate in activities where a loss of awareness could harm you or someone else. No swimming alone, no tub bathing, no hot tubs, no driving, no operating motorized vehicles (cars, ATVs, motocycles, etc), lawnmowers, power tools or firearms. No standing at heights, such as rooftops, ladders or stairs. Avoid hot objects such as stoves, heaters, open fires. Wear a helmet when riding a bicycle, scooter, skateboard, etc. and avoid areas of traffic. Set your water heater to 120 degrees or less.   LOW POTASSIUM - start daily multi-vitamin - eat 1 banana per day  HIGH SUGAR - check labs - setup primary care appointment ([email or web address])

## 2019-10-16 NOTE — Progress Notes (Signed)
[facility]  PATIENT: Reginald Ray DOB: [birth year]  REFERRING CLINICIAN: Santos-Sanchez, I HISTORY FROM: patient and fiance REASON FOR VISIT: follow up   HISTORICAL  CHIEF COMPLAINT:  Chief Complaint  Patient presents with  . Seizures    rm 5 One Yr FU, emergency contact/fiancee- [name], "had seizure on 10/13/19, went to ED, did not miss medicine"     HISTORY OF PRESENT ILLNESS:   UPDATE (10/16/19, VRP): Since last visit, had breakthrough seizures in Feb 2020, June 2020,  Aug 2020, Sep 2020, Oct 2020, Nov 15 and Oct 13, 2019.  Patient denies any missed doses of medication.  He was drinking beer before August 2020 but has stopped since that time.  He works third shift and has trouble sleeping during the daytime.  He averages 3 to 4 hours of sleep per day.  Patient now on levetiracetam 750 mg twice a day.  Last seizure was 10/13/2019.  Patient tends to be very confused and aggressive postictally.  This is the main reason why family called 911 for help and goes to ER for evaluation.  PRIOR HPI (02/28/18): 43 year old male here for evaluation of seizure.  Patient had one episode of possible seizure or syncope in 1997.  He was not started on antiseizure medicine at that time.  12/13/17 patient came home from third shift at work, and at 4 in the morning had loss of consciousness with generalized convulsions lasting 5 minutes.  EMS was called and patient was taken to the emergency room.  He arrived emergency room around 5 AM.  By 9 AM patient had a second seizure.  Patient was admitted to the hospital for evaluation.  He was started on IV levetiracetam.  Seizure workup was completed.  Patient was discharged on oral levetiracetam 500 mg twice a day.  Since that time patient is doing well.  No further seizures.  No family history of seizure.  Trigger factors may have included decreased sleep in the previous few days leading up to the seizure.   REVIEW OF SYSTEMS:  Full 14 system review of systems performed and negative with exception of: As per HPI.  ALLERGIES: No Known Allergies  HOME MEDICATIONS: Outpatient Medications Prior to Visit  Medication Sig Dispense Refill  . acetaminophen (TYLENOL) 500 MG tablet Take 1,000 mg by mouth every 6 (six) hours as needed for mild pain.    Marland Kitchen levETIRAcetam (KEPPRA) 750 MG tablet Take 1 tablet (750 mg total) by mouth 2 (two) times daily for 15 days. 30 tablet 0  . potassium chloride (K-DUR) 10 MEQ tablet Take 1 tablet (10 mEq total) by mouth daily for 5 days. (Patient not taking: Reported on 04/30/2019) 5 tablet 0   No facility-administered medications prior to visit.     PAST MEDICAL HISTORY: Past Medical History:  Diagnosis Date  . Seizure (HCC) 1997; 12/13/2017 X 2   sz 10/13/19  . Seizures (HCC)   . Substance abuse (HCC)     PAST SURGICAL HISTORY: Past Surgical History:  Procedure Laterality Date  . NO PAST SURGERIES      FAMILY HISTORY: Family History  Problem Relation Age of Onset  . Hypertension Mother   . Hypertension Father     SOCIAL HISTORY:  Social History   Socioeconomic History  . Marital status: Divorced    Spouse name: Not on file  . Number of children: Not on file  . Years of education: Not on file  . Highest education level: Not on file  Occupational  History  . Not on file  Social Needs  . Financial resource strain: Not on file  . Food insecurity    Worry: Not on file    Inability: Not on file  . Transportation needs    Medical: Not on file    Non-medical: Not on file  Tobacco Use  . Smoking status: Former Smoker    Packs/day: 1.00    Years: 10.00    Pack years: 10.00    Quit date: 02/29/2008    Years since quitting: 11.6  . Smokeless tobacco: Never Used  Substance and Sexual Activity  . Alcohol use: Yes    Alcohol/week: 5.0 standard drinks    Types: 5 Cans of beer per week    Comment: 02/28/18 none  . Drug use: Yes    Types: Marijuana    Comment: 12/13/2017  "qd", 02/28/18 "every now andd then"  . Sexual activity: Not Currently  Lifestyle  . Physical activity    Days per week: Not on file    Minutes per session: Not on file  . Stress: Not on file  Relationships  . Social Herbalist on phone: Not on file    Gets together: Not on file    Attends religious service: Not on file    Active member of club or organization: Not on file    Attends meetings of clubs or organizations: Not on file    Relationship status: Not on file  . Intimate partner violence    Fear of current or ex partner: Not on file    Emotionally abused: Not on file    Physically abused: Not on file    Forced sexual activity: Not on file  Other Topics Concern  . Not on file  Social History Narrative   Lives with fiancee, [name]   Caffeine- ginger ale, coffee- 12 oz daily   [employer]- loads trucks   10th grade education     PHYSICAL EXAM  GENERAL EXAM/CONSTITUTIONAL: Vitals:  Vitals:   10/16/19 1045  BP: 118/72  Pulse: 68  Temp: 97.7 F (36.5 C)  Weight: 177 lb 9.6 oz (80.6 kg)  Height: 6\' 2"  (1.88 m)   Body mass index is 22.8 kg/m. No exam data present  Patient is in no distress; well developed, nourished and groomed; neck is supple  CARDIOVASCULAR:  Examination of carotid arteries is normal; no carotid bruits  Regular rate and rhythm, no murmurs  Examination of peripheral vascular system by observation and palpation is normal  EYES:  Ophthalmoscopic exam of optic discs and posterior segments is normal; no papilledema or hemorrhages  MUSCULOSKELETAL:  Gait, strength, tone, movements noted in Neurologic exam below  NEUROLOGIC: MENTAL STATUS:  No flowsheet data found.  awake, alert, oriented to person, place and time  recent and remote memory intact  normal attention and concentration  language fluent, comprehension intact, naming intact,   fund of knowledge appropriate  CRANIAL NERVE:   2nd - no papilledema on fundoscopic  exam  2nd, 3rd, 4th, 6th - pupils equal and reactive to light, visual fields full to confrontation, extraocular muscles intact, no nystagmus  5th - facial sensation symmetric  7th - facial strength symmetric  8th - hearing intact  9th - palate elevates symmetrically, uvula midline  11th - shoulder shrug symmetric  12th - tongue protrusion midline  MOTOR:   normal bulk and tone, full strength in the BUE, BLE  SENSORY:   normal and symmetric to light touch, temperature, vibration  COORDINATION:   finger-nose-finger, fine finger movements normal  REFLEXES:   deep tendon reflexes present and symmetric  GAIT/STATION:   narrow based gait; romberg is negative    DIAGNOSTIC DATA (LABS, IMAGING, TESTING) - I reviewed patient records, labs, notes, testing and imaging myself where available.  Lab Results  Component Value Date   WBC 9.4 10/13/2019   HGB 12.9 (L) 10/13/2019   HCT 39.2 10/13/2019   MCV 93.6 10/13/2019   PLT 330 10/13/2019      Component Value Date/Time   NA 138 10/13/2019 1017   NA 140 12/23/2017 1518   K 3.0 (L) 10/13/2019 1017   CL 105 10/13/2019 1017   CO2 21 (L) 10/13/2019 1017   GLUCOSE 151 (H) 10/13/2019 1017   BUN 8 10/13/2019 1017   BUN 11 12/23/2017 1518   CREATININE 1.20 10/13/2019 1017   CALCIUM 9.1 10/13/2019 1017   PROT 6.3 (L) 10/13/2019 1017   ALBUMIN 4.4 10/13/2019 1017   AST 42 (H) 10/13/2019 1017   ALT 27 10/13/2019 1017   ALKPHOS 76 10/13/2019 1017   BILITOT 1.1 10/13/2019 1017   GFRNONAA >60 10/13/2019 1017   GFRAA >60 10/13/2019 1017   No results found for: CHOL, HDL, LDLCALC, LDLDIRECT, TRIG, CHOLHDL No results found for: ZOXW9UHGBA1C No results found for: VITAMINB12 No results found for: TSH   12/13/17 MRI brain [I reviewed images myself and agree with interpretation. -VRP]  1. Limited 4 sequence noncontrast MRI, patient was unable to tolerate further imaging. 2. Normal noncontrast MRI of the head.  12/13/17 EEG  -  This is a normal EEG for the patients stated age.  There were no focal, hemispheric or lateralizing features.  No epileptiform activity was recorded.  A normal EEG does not exclude the diagnosis of a seizure disorder and if seizure remains high on the list of differential diagnosis, an ambulatory EEG may be of value.  Clinical correlation is required.     ASSESSMENT AND PLAN  43 y.o. year old male here with generalized seizure disorder.   Dx: seizure disorder (since 1997? Last event 10/13/19)  1. Seizure disorder (HCC)    PLAN:  SEIZURE DISORDER  - increase levetiracetam to 1500mg  twice a day   - According to [place] law, you can not drive unless you are seizure / syncope free for at least 6 months and under physician's care.   - Please maintain precautions. Do not participate in activities where a loss of awareness could harm you or someone else. No swimming alone, no tub bathing, no hot tubs, no driving, no operating motorized vehicles (cars, ATVs, motocycles, etc), lawnmowers, power tools or firearms. No standing at heights, such as rooftops, ladders or stairs. Avoid hot objects such as stoves, heaters, open fires. Wear a helmet when riding a bicycle, scooter, skateboard, etc. and avoid areas of traffic. Set your water heater to 120 degrees or less.   LOW POTASSIUM - start daily multi-vitamin - eat 1 banana per day   HIGH SUGAR - check labs - refer to primary care appointment ([email or web address])  Orders Placed This Encounter  Procedures  . Hemoglobin A1c  . Basic Metabolic Panel  . Ambulatory referral to Internal Medicine   Meds ordered this encounter  Medications  . levETIRAcetam (KEPPRA) 750 MG tablet    Sig: Take 2 tablets (1,500 mg total) by mouth 2 (two) times daily.    Dispense:  360 tablet    Refill:  4   Return in about 3 months (around  01/16/2020).  I reviewed images, labs, notes, records myself. I summarized findings and reviewed with patient, for  this high risk condition (seizures) requiring high complexity decision making.    Suanne Marker, MD 10/16/2019, 10:53 AM Certified in Neurology, Neurophysiology and Neuroimaging  [facility] [address], [address] [place], [place] [address] [phone]

## 2019-10-17 ENCOUNTER — Telehealth: Payer: Self-pay | Admitting: *Deleted

## 2019-10-17 LAB — BASIC METABOLIC PANEL
BUN/Creatinine Ratio: 7 — ABNORMAL LOW (ref 9–20)
BUN: 7 mg/dL (ref 6–24)
CO2: 23 mmol/L (ref 20–29)
Calcium: 9.6 mg/dL (ref 8.7–10.2)
Chloride: 106 mmol/L (ref 96–106)
Creatinine, Ser: 0.97 mg/dL (ref 0.76–1.27)
GFR calc Af Amer: 109 mL/min/{1.73_m2} (ref >59)
GFR calc non Af Amer: 94 mL/min/{1.73_m2} (ref >59)
Glucose: 105 mg/dL — ABNORMAL HIGH (ref 65–99)
Potassium: 3.9 mmol/L (ref 3.5–5.2)
Sodium: 143 mmol/L (ref 134–144)

## 2019-10-17 LAB — HEMOGLOBIN A1C
Est. average glucose Bld gHb Est-mCnc: 120 mg/dL
Hgb A1c MFr Bld: 5.8 % — ABNORMAL HIGH (ref 4.8–5.6)

## 2019-10-17 NOTE — Telephone Encounter (Signed)
Spoke with patient and informed him his labs showed the sugar and potassium are improved. The a1c is borderline. I advised that is possible indicator of diabetes or developing diabetes. I advised Dr Leta Baptist wants him to follow up and establish with PCP for general health. Patient verbalized understanding, appreciation.

## 2019-11-04 ENCOUNTER — Encounter (HOSPITAL_COMMUNITY): Payer: Self-pay | Admitting: Emergency Medicine

## 2019-11-04 ENCOUNTER — Other Ambulatory Visit: Payer: Self-pay

## 2019-11-04 ENCOUNTER — Emergency Department (HOSPITAL_COMMUNITY)
Admission: EM | Admit: 2019-11-04 | Discharge: 2019-11-04 | Disposition: A | Discharge status: Home / Self Care | Payer: BC Managed Care – PPO | Attending: Emergency Medicine | Admitting: Emergency Medicine

## 2019-11-04 DIAGNOSIS — R569 Unspecified convulsions: Secondary | ICD-10-CM | POA: Diagnosis not present

## 2019-11-04 DIAGNOSIS — Z87891 Personal history of nicotine dependence: Secondary | ICD-10-CM | POA: Diagnosis not present

## 2019-11-04 LAB — CBC WITH DIFFERENTIAL/PLATELET
Abs Immature Granulocytes: 0.02 10*3/uL (ref 0.00–0.07)
Basophils Absolute: 0 10*3/uL (ref 0.0–0.1)
Basophils Relative: 1 %
Eosinophils Absolute: 0 10*3/uL (ref 0.0–0.5)
Eosinophils Relative: 1 %
HCT: 40.7 % (ref 39.0–52.0)
Hemoglobin: 13.6 g/dL (ref 13.0–17.0)
Immature Granulocytes: 0 %
Lymphocytes Relative: 14 %
Lymphs Abs: 0.9 10*3/uL (ref 0.7–4.0)
MCH: 30.6 pg (ref 26.0–34.0)
MCHC: 33.4 g/dL (ref 30.0–36.0)
MCV: 91.5 fL (ref 80.0–100.0)
Monocytes Absolute: 0.4 10*3/uL (ref 0.1–1.0)
Monocytes Relative: 6 %
Neutro Abs: 4.9 10*3/uL (ref 1.7–7.7)
Neutrophils Relative %: 78 %
Platelets: 302 10*3/uL (ref 150–400)
RBC: 4.45 MIL/uL (ref 4.22–5.81)
RDW: 13.4 % (ref 11.5–15.5)
WBC: 6.3 10*3/uL (ref 4.0–10.5)
nRBC: 0 % (ref 0.0–0.2)

## 2019-11-04 LAB — COMPREHENSIVE METABOLIC PANEL
ALT: 22 U/L (ref 0–44)
AST: 22 U/L (ref 15–41)
Albumin: 4 g/dL (ref 3.5–5.0)
Alkaline Phosphatase: 78 U/L (ref 38–126)
Anion gap: 9 (ref 5–15)
BUN: 6 mg/dL (ref 6–20)
CO2: 22 mmol/L (ref 22–32)
Calcium: 9.2 mg/dL (ref 8.9–10.3)
Chloride: 110 mmol/L (ref 98–111)
Creatinine, Ser: 1.11 mg/dL (ref 0.61–1.24)
GFR calc Af Amer: >60 mL/min (ref >60)
GFR calc non Af Amer: >60 mL/min (ref >60)
Glucose, Bld: 75 mg/dL (ref 70–99)
Potassium: 3.5 mmol/L (ref 3.5–5.1)
Sodium: 141 mmol/L (ref 135–145)
Total Bilirubin: 1 mg/dL (ref 0.3–1.2)
Total Protein: 6.2 g/dL — ABNORMAL LOW (ref 6.5–8.1)

## 2019-11-04 LAB — MAGNESIUM: Magnesium: 2.3 mg/dL (ref 1.7–2.4)

## 2019-11-04 MED ORDER — SODIUM CHLORIDE 0.9 % IV SOLN: 2000.0000 mg | Freq: Once | INTRAVENOUS | Status: DC

## 2019-11-04 MED ORDER — LEVETIRACETAM IN NACL 1000 MG/100ML IV SOLN
1000.0000 mg | INTRAVENOUS | Status: AC
  Administered 2019-11-04 (×2): 1000 mg via INTRAVENOUS
  Filled 2019-11-04 (×2): qty 100

## 2019-11-04 NOTE — ED Notes (Signed)
Patient verbalizes understanding of discharge instructions. Opportunity for questioning and answers were provided. Armband removed by staff, pt discharged from ED.  

## 2019-11-04 NOTE — ED Provider Notes (Addendum)
[facility] EMERGENCY DEPARTMENT Provider Note   CSN: [id number] Arrival date & time: 11/04/19  6734     History Chief Complaint  Patient presents with  . Seizures    Reginald Ray is a 43 y.o. male.  HPI  42 year old male presents with seizure.  He states that earlier this morning while coming back from the bathroom he had a witnessed seizure by his girlfriend.  Has been having multiple breakthrough seizures.  On 11/24 had his Keppra increased from 750 mg twice daily to 1500 mg twice daily.  Has been compliant with his dosage.  Next dose is due at 10 AM today but he took last night's dose.  Works third shift and has been more fatigued due to increased workload.  Did not go to work last night due to this fatigue.  No fevers, headache, injury.  Past Medical History:  Diagnosis Date  . Seizure ([place]) 1997; 12/13/2017 X 2   sz 10/13/19  . Seizures ([place])   . Substance abuse [facility])     Patient Active Problem List   Diagnosis Date Noted  . Seizure ([place]) 12/13/2017  . Rhabdomyolysis 12/13/2017  . Hypokalemia 12/13/2017    Past Surgical History:  Procedure Laterality Date  . NO PAST SURGERIES         Family History  Problem Relation Age of Onset  . Hypertension Mother   . Hypertension Father     Social History   Tobacco Use  . Smoking status: Former Smoker    Packs/day: 1.00    Years: 10.00    Pack years: 10.00    Quit date: 02/29/2008    Years since quitting: 11.6  . Smokeless tobacco: Never Used  Substance Use Topics  . Alcohol use: Yes    Alcohol/week: 5.0 standard drinks    Types: 5 Cans of beer per week    Comment: 02/28/18 none  . Drug use: Yes    Types: Marijuana    Comment: 12/13/2017 "qd", 02/28/18 "every now andd then"    Home Medications Prior to Admission medications   Medication Sig Start Date End Date Taking? Authorizing Provider  acetaminophen (TYLENOL) 500 MG tablet Take 1,000 mg by mouth every 6 (six) hours as needed for mild  pain.    [provider]  levETIRAcetam (KEPPRA) 750 MG tablet Take 2 tablets (1,500 mg total) by mouth 2 (two) times daily. 10/16/19   Penumalli, Earlean Polka, MD    Allergies    Patient has no known allergies.  Review of Systems   Review of Systems  Constitutional: Negative for fever.  Gastrointestinal: Negative for vomiting.  Musculoskeletal: Negative for neck pain.  Neurological: Positive for seizures. Negative for headaches.  All other systems reviewed and are negative.   Physical Exam Updated Vital Signs BP (!) 142/90 (BP Location: Right Arm)   Pulse 90   Temp 98.3 F (36.8 C) (Oral)   Resp 17   SpO2 100%   Physical Exam Vitals and nursing note reviewed.  Constitutional:      General: He is not in acute distress.    Appearance: He is well-developed. He is not ill-appearing.  HENT:     Head: Normocephalic and atraumatic.     Right Ear: External ear normal.     Left Ear: External ear normal.     Nose: Nose normal.  Eyes:     General:        Right eye: No discharge.  Left eye: No discharge.     Extraocular Movements: Extraocular movements intact.     Pupils: Pupils are equal, round, and reactive to light.  Cardiovascular:     Rate and Rhythm: Normal rate and regular rhythm.     Heart sounds: Normal heart sounds.  Pulmonary:     Effort: Pulmonary effort is normal.     Breath sounds: Normal breath sounds.  Abdominal:     Palpations: Abdomen is soft.     Tenderness: There is no abdominal tenderness.  Musculoskeletal:     Cervical back: Normal range of motion and neck supple. No rigidity.  Skin:    General: Skin is warm and dry.  Neurological:     Mental Status: He is alert.     Comments: CN 3-12 grossly intact. 5/5 strength in all 4 extremities. Grossly normal sensation. Normal finger to nose.   Psychiatric:        Mood and Affect: Mood is not anxious.     ED Results / Procedures / Treatments   Labs (all labs ordered are listed, but only  abnormal results are displayed) Labs Reviewed  COMPREHENSIVE METABOLIC PANEL - Abnormal; Notable for the following components:      Result Value   Total Protein 6.2 (*)    All other components within normal limits  CBC WITH DIFFERENTIAL/PLATELET  MAGNESIUM    EKG None  Radiology No results found.  Procedures Procedures (including critical care time)  Medications Ordered in ED Medications  levETIRAcetam (KEPPRA) IVPB 1000 mg/100 mL premix (0 mg Intravenous Stopped 11/04/19 1016)    ED Course  I have reviewed the triage vital signs and the nursing notes.  Pertinent labs & imaging results that were available during my care of the patient were reviewed by me and considered in my medical decision making (see chart for details).    MDM Rules/Calculators/A&P     CHA2DS2/VAS Stroke Risk Points      N/A >= 2 Points: High Risk  1 - 1.99 Points: Medium Risk  0 Points: Low Risk    A final score could not be computed because of missing components.: Last  Change: N/A     This score determines the patient's risk of having a stroke if the  patient has atrial fibrillation.      This score is not applicable to this patient. Components are not  calculated.                   I discussed with pharmacy, patient can get 2g IV bolus of keppra here for breakthrough seizure and in lieu of his morning dose. No further seizure-like activity while in the emergency department.  We will have him call his neurologist tomorrow for possible medication adjustment.  Perhaps is having increased seizure-like activity from his job and lack of sleep.  However my suspicion of acute CNS process such as infection or stroke is unlikely.  Final Clinical Impression(s) / ED Diagnoses Final diagnoses:  Seizure [facility])    Rx / DC Orders ED Discharge Orders    None       Pricilla Loveless, MD 11/04/19 1053    Pricilla Loveless, MD 11/04/19 1054

## 2019-11-04 NOTE — ED Triage Notes (Signed)
Pt arrives to ED from home with complaints of a five minute witnessed grand mal seizure by patients wife. Patient was in bed and had no injury to head, neck, or extremities. Patient was incontinent during seizure. Patient does not remember seizure. Patient calm and cooperative at this time. Patient has been taking 1500mg  Keppra x2 daily and has not missed any doses.

## 2019-11-05 ENCOUNTER — Telehealth: Payer: Self-pay | Admitting: Diagnostic Neuroimaging

## 2019-11-05 NOTE — Telephone Encounter (Addendum)
Patient called to inform he had a seizure Sunday morning and was advised to FU in regards to seizures by the ED.  Patient states his seizure lasted between 5-10 min  Please follow up

## 2019-11-06 MED ORDER — LACOSAMIDE 50 MG PO TABS: 50.0000 mg | ORAL_TABLET | Freq: Two times a day (BID) | ORAL | 5 refills | Status: DC

## 2019-11-06 NOTE — Telephone Encounter (Addendum)
Called patient and informed him that Dr Leta Baptist has added Vimpat as another anti seizure medication. Advised he sent in new Rx to his pharmacy. He'll take one tablet twice daily with other medication, Keppra. I advised he call for any questions, concerns. Patient verbalized understanding, appreciation.

## 2019-11-06 NOTE — Telephone Encounter (Signed)
Start Vimpat 50 mg twice a day.  Meds ordered this encounter  Medications  . lacosamide (VIMPAT) 50 MG TABS tablet    Sig: Take 1 tablet (50 mg total) by mouth 2 (two) times daily.    Dispense:  60 tablet    Refill:  [place], MD 09/23/1116, 3:56 PM Certified in Neurology, Neurophysiology and Neuroimaging  [facility] [address], [place], [place] [address] [phone]

## 2020-03-27 ENCOUNTER — Other Ambulatory Visit: Payer: Self-pay | Admitting: Diagnostic Neuroimaging

## 2020-04-03 ENCOUNTER — Other Ambulatory Visit: Payer: Self-pay | Admitting: Diagnostic Neuroimaging

## 2020-04-03 MED ORDER — LACOSAMIDE 50 MG PO TABS: 50.0000 mg | ORAL_TABLET | Freq: Two times a day (BID) | ORAL | 5 refills | Status: DC

## 2020-04-03 NOTE — Telephone Encounter (Addendum)
Called patient and informed him that Keppra has additional refills on file. I advised Vimpat is due for new refills. He stated that was what he was calling about. I advised will have Dr Marjory Lies prescribe today; patient has only two tabs left.Patient verbalized understanding, appreciation.

## 2020-04-03 NOTE — Telephone Encounter (Signed)
Pt has called re: his 2 medications levETIRAcetam (KEPPRA) 750 MG tablet & lacosamide (VIMPAT) 50 MG TABS tablet. Pt states he will soon run out, he is asking for a call to discuss his medication.

## 2020-04-03 NOTE — Addendum Note (Signed)
Addended by: [place] Pink on: 04/03/2020 02:41 PM   Modules accepted: Orders

## 2020-04-16 ENCOUNTER — Emergency Department (HOSPITAL_COMMUNITY)
Admission: EM | Admit: 2020-04-16 | Discharge: 2020-04-16 | Disposition: A | Discharge status: Home / Self Care | Payer: BC Managed Care – PPO | Attending: Emergency Medicine | Admitting: Emergency Medicine

## 2020-04-16 ENCOUNTER — Encounter (HOSPITAL_COMMUNITY): Payer: Self-pay

## 2020-04-16 ENCOUNTER — Emergency Department (HOSPITAL_COMMUNITY): Payer: BC Managed Care – PPO

## 2020-04-16 DIAGNOSIS — G40909 Epilepsy, unspecified, not intractable, without status epilepticus: Secondary | ICD-10-CM | POA: Diagnosis not present

## 2020-04-16 DIAGNOSIS — Z87891 Personal history of nicotine dependence: Secondary | ICD-10-CM | POA: Insufficient documentation

## 2020-04-16 DIAGNOSIS — Z79899 Other long term (current) drug therapy: Secondary | ICD-10-CM | POA: Insufficient documentation

## 2020-04-16 DIAGNOSIS — R569 Unspecified convulsions: Secondary | ICD-10-CM | POA: Diagnosis present

## 2020-04-16 LAB — BASIC METABOLIC PANEL
Anion gap: 16 — ABNORMAL HIGH (ref 5–15)
BUN: 8 mg/dL (ref 6–20)
CO2: 18 mmol/L — ABNORMAL LOW (ref 22–32)
Calcium: 8.9 mg/dL (ref 8.9–10.3)
Chloride: 105 mmol/L (ref 98–111)
Creatinine, Ser: 1.36 mg/dL — ABNORMAL HIGH (ref 0.61–1.24)
GFR calc Af Amer: >60 mL/min (ref >60)
GFR calc non Af Amer: >60 mL/min (ref >60)
Glucose, Bld: 184 mg/dL — ABNORMAL HIGH (ref 70–99)
Potassium: 3.4 mmol/L — ABNORMAL LOW (ref 3.5–5.1)
Sodium: 139 mmol/L (ref 135–145)

## 2020-04-16 LAB — CBG MONITORING, ED: Glucose-Capillary: 189 mg/dL — ABNORMAL HIGH (ref 70–99)

## 2020-04-16 MED ORDER — LEVETIRACETAM IN NACL 1500 MG/100ML IV SOLN
1500.0000 mg | Freq: Once | INTRAVENOUS | Status: AC
  Administered 2020-04-16: 1500 mg via INTRAVENOUS
  Filled 2020-04-16: qty 100

## 2020-04-16 MED ORDER — LEVETIRACETAM 750 MG PO TABS: 1500.0000 mg | ORAL_TABLET | Freq: Two times a day (BID) | ORAL | 0 refills | Status: DC

## 2020-04-16 NOTE — ED Notes (Signed)
Pt was given 5mg  Haldol and 7 mg versed by EMS

## 2020-04-16 NOTE — ED Triage Notes (Signed)
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

## 2020-04-16 NOTE — ED Provider Notes (Signed)
[facility]-EMERGENCY DEPT Provider Note   CSN: [id number] Arrival date & time: 04/16/20  0418     History Chief Complaint  Patient presents with  . Seizures  . Aggressive Behavior    Reginald Ray is a 44 y.o. male.  The history is provided by the EMS personnel. The history is limited by the condition of the patient.  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  Initial focality:  None Episode characteristics: abnormal movements and generalized shaking   Postictal symptoms: confusion   Return to baseline: no   Severity:  Mild Timing:  Once Number of seizures this episode:  1 Progression:  Resolved Context: not alcohol withdrawal, not emotional upset, not fever, not intracranial lesion, not intracranial shunt and not possible hypoglycemia   PTA treatment:  Midazolam History of seizures: yes   Date of most recent prior episode:  Today  Severity:  Mild Seizure control level:  Poorly controlled Current therapy:  Levetiracetam (vimpat )      Past Medical History:  Diagnosis Date  . Seizure ([facility]) 1997; 12/13/2017 X 2   sz 10/13/19  . Seizures ([facility])   . Substance abuse [facility])     Patient Active Problem List   Diagnosis Date Noted  . Seizure ([facility]) 12/13/2017  . Rhabdomyolysis 12/13/2017  . Hypokalemia 12/13/2017    Past Surgical History:  Procedure Laterality Date  . NO PAST SURGERIES         Family History  Problem Relation Age of Onset  . Hypertension Mother   . Hypertension Father     Social History   Tobacco Use  . Smoking status: Former Smoker    Packs/day: 1.00    Years: 10.00    Pack years: 10.00    Quit date: 02/29/2008    Years since quitting: 12.1  . Smokeless tobacco: Never Used  Substance Use Topics  . Alcohol use: Yes    Alcohol/week: 5.0 standard  drinks    Types: 5 Cans of beer per week    Comment: 02/28/18 none  . Drug use: Yes    Types: Marijuana    Comment: 12/13/2017 "qd", 02/28/18 "every now andd then"    Home Medications Prior to Admission medications   Medication Sig Start Date End Date Taking? Authorizing Provider  acetaminophen (TYLENOL) 500 MG tablet Take 1,000 mg by mouth every 6 (six) hours as needed for mild pain.    [provider]  lacosamide (VIMPAT) 50 MG TABS tablet Take 1 tablet (50 mg total) by mouth 2 (two) times daily. 04/03/20   Penumalli, Glenford Bayley, MD  levETIRAcetam (KEPPRA) 750 MG tablet Take 2 tablets (1,500 mg total) by mouth 2 (two) times daily. 10/16/19   Penumalli, Glenford Bayley, MD    Allergies    Patient has no known allergies.  Review of Systems   Review of Systems  Unable to perform ROS: Acuity of condition  Neurological: Positive for seizures.    Physical Exam Updated Vital Signs BP (!) 137/99   Pulse (!) 116   Temp 97.7 F (36.5 C) (Oral)   Resp 17   SpO2 100%   Physical Exam Vitals and nursing note reviewed.  Constitutional:      Appearance: He is normal weight. He is not diaphoretic.  HENT:     Head:  Normocephalic and atraumatic.     Nose: Nose normal.     Mouth/Throat:     Mouth: Mucous membranes are moist.  Eyes:     Conjunctiva/sclera: Conjunctivae normal.  Cardiovascular:     Rate and Rhythm: Normal rate and regular rhythm.     Pulses: Normal pulses.     Heart sounds: Normal heart sounds.  Pulmonary:     Effort: Pulmonary effort is normal.     Breath sounds: Normal breath sounds.  Abdominal:     General: Abdomen is flat. Bowel sounds are normal.     Tenderness: There is no abdominal tenderness. There is no guarding.  Musculoskeletal:        General: Normal range of motion.     Cervical back: Normal range of motion and neck supple.  Skin:    General: Skin is warm and dry.     Capillary Refill: Capillary refill takes less than 2 seconds.  Neurological:      Deep Tendon Reflexes: Reflexes normal.  Psychiatric:     Comments: Unable      ED Results / Procedures / Treatments   Labs (all labs ordered are listed, but only abnormal results are displayed) Results for orders placed or performed during the hospital encounter of 93/26/71  Basic metabolic panel  Result Value Ref Range   Sodium 139 135 - 145 mmol/L   Potassium 3.4 (L) 3.5 - 5.1 mmol/L   Chloride 105 98 - 111 mmol/L   CO2 18 (L) 22 - 32 mmol/L   Glucose, Bld 184 (H) 70 - 99 mg/dL   BUN 8 6 - 20 mg/dL   Creatinine, Ser 1.36 (H) 0.61 - 1.24 mg/dL   Calcium 8.9 8.9 - 10.3 mg/dL   GFR calc non Af Amer >60 >60 mL/min   GFR calc Af Amer >60 >60 mL/min   Anion gap 16 (H) 5 - 15  CBG monitoring, ED  Result Value Ref Range   Glucose-Capillary 189 (H) 70 - 99 mg/dL   No results found.  EKG  EKG Interpretation  Date/Time:  Wednesday Apr 16 2020 05:07:18 EDT Ventricular Rate:  100 PR Interval:    QRS Duration: 97 QT Interval:  357 QTC Calculation: 461 R Axis:   118 Text Interpretation: Sinus tachycardia Right atrial enlargement Right ventricular hypertrophy Confirmed by Dory Horn) on 04/16/2020 5:12:19 AM       Radiology No results found.  Procedures Procedures (including critical care time)  Medications Ordered in ED Medications  levETIRAcetam (KEPPRA) IVPB 1500 mg/ 100 mL premix (1,500 mg Intravenous New Bag/Given 04/16/20 0501)    ED Course  I have reviewed the triage vital signs and the nursing notes.  Pertinent labs & imaging results that were available during my care of the patient were reviewed by me and considered in my medical decision making (see chart for details).    No more seizures, awake and alert.  Reloaded with keppra. No driving for at least six months (must be seizure free all 6 months) and or until cleared by neurology.  This was also printed on your discharge instructions.    Reginald Ray was evaluated in Emergency Department on  04/16/2020 for the symptoms described in the history of present illness. He was evaluated in the context of the global COVID-19 pandemic, which necessitated consideration that the patient might be at risk for infection with the SARS-CoV-2 virus that causes COVID-19. Institutional protocols and algorithms that pertain to the evaluation of patients at risk  for COVID-19 are in a state of rapid change based on information released by regulatory bodies including the CDC and federal and state organizations. These policies and algorithms were followed during the patient's care in the ED.  Final Clinical Impression(s) / ED Diagnoses Final diagnoses:  None    Return for intractable cough, coughing up blood,fevers >100.4 unrelieved by medication, shortness of breath, intractable vomiting, chest pain, shortness of breath, weakness,numbness, changes in speech, facial asymmetry,abdominal pain, passing out,Inability to tolerate liquids or food, cough, altered mental status or any concerns. No signs of systemic illness or infection. The patient is nontoxic-appearing on exam and vital signs are within normal limits.   I have reviewed the triage vital signs and the nursing notes. Pertinent labs &imaging results that were available during my care of the patient were reviewed by me and considered in my medical decision making (see chart for details).After history, exam, and medical workup I feel the patient has beenappropriately medically screened and is safe for discharge home. Pertinent diagnoses were discussed with the patient. Patient was given return precautions. Rx / DC Orders ED Discharge Orders    None       Jaelon Gatley, MD 04/16/20 [phone]

## 2020-04-16 NOTE — Discharge Instructions (Signed)
You cannot drive until 6 months seizure-free or until cleared by neurology

## 2020-04-28 ENCOUNTER — Ambulatory Visit: Payer: BC Managed Care – PPO | Admitting: Diagnostic Neuroimaging

## 2020-04-28 ENCOUNTER — Encounter: Payer: Self-pay | Admitting: Diagnostic Neuroimaging

## 2020-04-28 VITALS — BP 134/80 | HR 75 | Ht 74.0 in | Wt 176.6 lb

## 2020-04-28 DIAGNOSIS — G40909 Epilepsy, unspecified, not intractable, without status epilepticus: Secondary | ICD-10-CM

## 2020-04-28 MED ORDER — LEVETIRACETAM 750 MG PO TABS: 1500.0000 mg | ORAL_TABLET | Freq: Two times a day (BID) | ORAL | 4 refills | Status: DC

## 2020-04-28 MED ORDER — LACOSAMIDE 100 MG PO TABS: 100.0000 mg | ORAL_TABLET | Freq: Two times a day (BID) | ORAL | 5 refills | Status: DC
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
## 2020-04-28 NOTE — Patient Instructions (Signed)
SEIZURE DISORDER  - continue levetiracetam to 1500mg  twice a day   - increase vimpat to 100mg  twice a day   - According to [place] law, you can not drive unless you are seizure / syncope free for at least 6 months and under physician's care.   - Please maintain precautions. Do not participate in activities where a loss of awareness could harm you or someone else. No swimming alone, no tub bathing, no hot tubs, no driving, no operating motorized vehicles (cars, ATVs, motocycles, etc), lawnmowers, power tools or firearms. No standing at heights, such as rooftops, ladders or stairs. Avoid hot objects such as stoves, heaters, open fires. Wear a helmet when riding a bicycle, scooter, skateboard, etc. and avoid areas of traffic. Set your water heater to 120 degrees or less.

## 2020-10-27 ENCOUNTER — Other Ambulatory Visit: Payer: Self-pay | Admitting: Diagnostic Neuroimaging

## 2020-10-27 DIAGNOSIS — G40909 Epilepsy, unspecified, not intractable, without status epilepticus: Secondary | ICD-10-CM

## 2020-10-27 MED ORDER — LACOSAMIDE 100 MG PO TABS: 100.0000 mg | ORAL_TABLET | Freq: Two times a day (BID) | ORAL | 5 refills | Status: DC

## 2020-10-27 NOTE — Addendum Note (Signed)
Addended by: [place] Pink on: 10/27/2020 02:08 PM   Modules accepted: Orders

## 2020-10-27 NOTE — Telephone Encounter (Signed)
Pt. is requesting a refill for lacosamide 100 MG TABS.  Pharmacy: [facility] [phone]

## 2020-11-12 ENCOUNTER — Encounter (HOSPITAL_COMMUNITY): Payer: Self-pay | Admitting: Emergency Medicine

## 2020-11-12 ENCOUNTER — Emergency Department (HOSPITAL_COMMUNITY)
Admission: EM | Admit: 2020-11-12 | Discharge: 2020-11-12 | Disposition: A | Discharge status: Home / Self Care | Payer: BC Managed Care – PPO | Attending: Emergency Medicine | Admitting: Emergency Medicine

## 2020-11-12 ENCOUNTER — Other Ambulatory Visit: Payer: Self-pay

## 2020-11-12 DIAGNOSIS — Z5321 Procedure and treatment not carried out due to patient leaving prior to being seen by health care provider: Secondary | ICD-10-CM | POA: Insufficient documentation

## 2020-11-12 DIAGNOSIS — U071 COVID-19: Secondary | ICD-10-CM | POA: Insufficient documentation

## 2020-11-12 DIAGNOSIS — R509 Fever, unspecified: Secondary | ICD-10-CM | POA: Diagnosis present

## 2020-11-12 LAB — RESP PANEL BY RT-PCR (FLU A&B, COVID) ARPGX2
Influenza A by PCR: NEGATIVE
Influenza B by PCR: NEGATIVE
SARS Coronavirus 2 by RT PCR: POSITIVE — AB

## 2020-11-12 NOTE — ED Notes (Addendum)
Pt called 3x no answer  

## 2020-11-12 NOTE — ED Triage Notes (Signed)
Pt c/o fever, chills and loss of taste and smell. Unvaccinated for covid.

## 2020-11-13 ENCOUNTER — Telehealth: Payer: Self-pay | Admitting: Oncology

## 2020-11-13 ENCOUNTER — Ambulatory Visit (HOSPITAL_COMMUNITY)
Admission: RE | Admit: 2020-11-13 | Discharge: 2020-11-13 | Disposition: A | Discharge status: Home / Self Care | Payer: BC Managed Care – PPO | Source: Ambulatory Visit | Attending: Pulmonary Disease | Admitting: Pulmonary Disease

## 2020-11-13 ENCOUNTER — Other Ambulatory Visit: Payer: Self-pay | Admitting: Oncology

## 2020-11-13 ENCOUNTER — Encounter: Payer: Self-pay | Admitting: Oncology

## 2020-11-13 DIAGNOSIS — U071 COVID-19: Secondary | ICD-10-CM | POA: Diagnosis present

## 2020-11-13 MED ORDER — DIPHENHYDRAMINE HCL 50 MG/ML IJ SOLN: 50.0000 mg | Freq: Once | INTRAMUSCULAR | Status: DC | PRN

## 2020-11-13 MED ORDER — FAMOTIDINE IN NACL 20-0.9 MG/50ML-% IV SOLN: 20.0000 mg | Freq: Once | INTRAVENOUS | Status: DC | PRN

## 2020-11-13 MED ORDER — SODIUM CHLORIDE 0.9 % IV SOLN: INTRAVENOUS | Status: DC | PRN

## 2020-11-13 MED ORDER — ALBUTEROL SULFATE HFA 108 (90 BASE) MCG/ACT IN AERS: 2.0000 | INHALATION_SPRAY | Freq: Once | RESPIRATORY_TRACT | Status: DC | PRN

## 2020-11-13 MED ORDER — METHYLPREDNISOLONE SODIUM SUCC 125 MG IJ SOLR: 125.0000 mg | Freq: Once | INTRAMUSCULAR | Status: DC | PRN

## 2020-11-13 MED ORDER — SODIUM CHLORIDE 0.9 % IV SOLN: Freq: Once | INTRAVENOUS | Status: AC

## 2020-11-13 MED ORDER — EPINEPHRINE 0.3 MG/0.3ML IJ SOAJ: 0.3000 mg | Freq: Once | INTRAMUSCULAR | Status: DC | PRN

## 2020-11-13 NOTE — Telephone Encounter (Signed)
I connected by phone with  Reginald Ray to discuss the potential use of an new treatment for mild to moderate COVID-19 viral infection in non-hospitalized patients.   This patient is a age/sex that meets the FDA criteria for Emergency Use Authorization of casirivimab\imdevimab.  Has a (+) direct SARS-CoV-2 viral test result 1. Has mild or moderate COVID-19  2. Is ? 46 years of age and weighs ? 40 kg 3. Is NOT hospitalized due to COVID-19 4. Is NOT requiring oxygen therapy or requiring an increase in baseline oxygen flow rate due to COVID-19 5. Is within 10 days of symptom onset 6. Has at least one of the high risk factor(s) for progression to severe COVID-19 and/or hospitalization as defined in EUA. ? Specific high risk criteria :  -Unvaccinated -Seizure disorder --Onset sx 11/08/20    I have spoken and communicated the following to the patient or parent/caregiver:   1. FDA has authorized the emergency use of casirivimab\imdevimab for the treatment of mild to moderate COVID-19 in adults and pediatric patients with positive results of direct SARS-CoV-2 viral testing who are 38 years of age and older weighing at least 40 kg, and who are at high risk for progressing to severe COVID-19 and/or hospitalization.   2. The significant known and potential risks and benefits of casirivimab\imdevimab, and the extent to which such potential risks and benefits are unknown.   3. Information on available alternative treatments and the risks and benefits of those alternatives, including clinical trials.   4. Patients treated with casirivimab\imdevimab should continue to self-isolate and use infection control measures (e.g., wear mask, isolate, social distance, avoid sharing personal items, clean and disinfect "high touch" surfaces, and frequent handwashing) according to CDC guidelines.    5. The patient or parent/caregiver has the option to accept or refuse casirivimab\imdevimab .   After reviewing this  information with the patient, The patient agreed to proceed with receiving casirivimab\imdevimab infusion and will be provided a copy of the Fact sheet prior to receiving the infusion.Mignon Pine, AGNP-C [phone] (Infusion Center Hotline)

## 2020-11-13 NOTE — Discharge Instructions (Signed)
10 Things You Can Do to Manage Your COVID-19 Symptoms at Home If you have possible or confirmed COVID-19: 1. Stay home from work and school. And stay away from other public places. If you must go out, avoid using any kind of public transportation, ridesharing, or taxis. 2. Monitor your symptoms carefully. If your symptoms get worse, call your healthcare provider immediately. 3. Get rest and stay hydrated. 4. If you have a medical appointment, call the healthcare provider ahead of time and tell them that you have or may have COVID-19. 5. For medical emergencies, call 911 and notify the dispatch personnel that you have or may have COVID-19. 6. Cover your cough and sneezes with a tissue or use the inside of your elbow. 7. Wash your hands often with soap and water for at least 20 seconds or clean your hands with an alcohol-based hand sanitizer that contains at least 60% alcohol. 8. As much as possible, stay in a specific room and away from other people in your home. Also, you should use a separate bathroom, if available. If you need to be around other people in or outside of the home, wear a mask. 9. Avoid sharing personal items with other people in your household, like dishes, towels, and bedding. 10. Clean all surfaces that are touched often, like counters, tabletops, and doorknobs. Use household cleaning sprays or wipes according to the label instructions. cdc.gov/coronavirus 05/23/2019 This information is not intended to replace advice given to you by your health care provider. Make sure you discuss any questions you have with your health care provider. Document Revised: 10/25/2019 Document Reviewed: 10/25/2019 Elsevier Patient Education  2020 Elsevier Inc. What types of side effects do monoclonal antibody drugs cause?  Common side effects  In general, the more common side effects caused by monoclonal antibody drugs include: . Allergic reactions, such as hives or itching . Flu-like signs and  symptoms, including chills, fatigue, fever, and muscle aches and pains . Nausea, vomiting . Diarrhea . Skin rashes . Low blood pressure   The CDC is recommending patients who receive monoclonal antibody treatments wait at least 90 days before being vaccinated.  Currently, there are no data on the safety and efficacy of mRNA COVID-19 vaccines in persons who received monoclonal antibodies or convalescent plasma as part of COVID-19 treatment. Based on the estimated half-life of such therapies as well as evidence suggesting that reinfection is uncommon in the 90 days after initial infection, vaccination should be deferred for at least 90 days, as a precautionary measure until additional information becomes available, to avoid interference of the antibody treatment with vaccine-induced immune responses. If you have any questions or concerns after the infusion please call the Advanced Practice Provider on call at [phone]. This number is ONLY intended for your use regarding questions or concerns about the infusion post-treatment side-effects.  Please do not provide this number to others for use. For return to work notes please contact your primary care provider.   If someone you know is interested in receiving treatment please have them call the COVID hotline at [phone].   

## 2020-11-13 NOTE — Progress Notes (Signed)
Patient reviewed Fact Sheet for Patients, Parents, and Caregivers for Emergency Use Authorization (EUA) of casirivimab/imdevimab for the Treatment of Coronavirus.  Patient also reviewed and is agreeable to the estimated cost of treatment.  Patient is agreeable to proceed.  

## 2020-11-13 NOTE — Progress Notes (Signed)
Note: BP elevated at discharge; denies headache or other symptoms; check BP twice daily  Diagnosis: COVID-19  Physician: Dr. Shan Levans  Procedure: [facility]fusion [facility] Med: casirivimab\imdevimab infusion - Provided patient with casirivimab\imdevimab fact sheet for patients, parents and caregivers prior to infusion.  Complications: No immediate complications noted.  Discharge: Discharged home   Gregary Signs 11/13/2020

## 2021-02-25 ENCOUNTER — Telehealth: Payer: Self-pay | Admitting: Neurology

## 2021-02-25 ENCOUNTER — Other Ambulatory Visit: Payer: Self-pay | Admitting: Neurology

## 2021-02-25 DIAGNOSIS — G40909 Epilepsy, unspecified, not intractable, without status epilepticus: Secondary | ICD-10-CM

## 2021-02-25 MED ORDER — LACOSAMIDE 100 MG PO TABS: 100.0000 mg | ORAL_TABLET | Freq: Two times a day (BID) | ORAL | 5 refills | Status: DC

## 2021-02-25 NOTE — Telephone Encounter (Signed)
Patient paged, he needed vimpat sent to a different pharmacy, I sent it to the one specified.

## 2021-02-25 NOTE — Progress Notes (Signed)
Patient called, he need vimpat sent to a different pharmacy. I called it in to the one specified by patient.

## 2021-04-07 ENCOUNTER — Emergency Department (HOSPITAL_COMMUNITY): Payer: BC Managed Care – PPO

## 2021-04-07 ENCOUNTER — Encounter (HOSPITAL_COMMUNITY): Payer: Self-pay

## 2021-04-07 ENCOUNTER — Other Ambulatory Visit: Payer: Self-pay

## 2021-04-07 ENCOUNTER — Emergency Department (HOSPITAL_COMMUNITY)
Admission: EM | Admit: 2021-04-07 | Discharge: 2021-04-08 | Disposition: A | Discharge status: Home / Self Care | Payer: BC Managed Care – PPO | Attending: Emergency Medicine | Admitting: Emergency Medicine

## 2021-04-07 DIAGNOSIS — R519 Headache, unspecified: Secondary | ICD-10-CM | POA: Diagnosis not present

## 2021-04-07 DIAGNOSIS — G40909 Epilepsy, unspecified, not intractable, without status epilepticus: Secondary | ICD-10-CM

## 2021-04-07 DIAGNOSIS — Y92410 Unspecified street and highway as the place of occurrence of the external cause: Secondary | ICD-10-CM | POA: Insufficient documentation

## 2021-04-07 DIAGNOSIS — Z87891 Personal history of nicotine dependence: Secondary | ICD-10-CM | POA: Diagnosis not present

## 2021-04-07 DIAGNOSIS — R569 Unspecified convulsions: Secondary | ICD-10-CM | POA: Insufficient documentation

## 2021-04-07 MED ORDER — LACOSAMIDE 100 MG PO TABS: 100.0000 mg | ORAL_TABLET | Freq: Two times a day (BID) | ORAL | 5 refills | Status: DC

## 2021-04-07 MED ORDER — LEVETIRACETAM 500 MG PO TABS
1500.0000 mg | ORAL_TABLET | Freq: Once | ORAL | Status: AC
  Administered 2021-04-07: 1500 mg via ORAL
  Filled 2021-04-07: qty 3

## 2021-04-07 MED ORDER — LACOSAMIDE 50 MG PO TABS
100.0000 mg | ORAL_TABLET | Freq: Two times a day (BID) | ORAL | Status: DC
  Administered 2021-04-07: 100 mg via ORAL
  Filled 2021-04-07: qty 2
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
## 2021-04-08 ENCOUNTER — Telehealth: Payer: Self-pay | Admitting: Surgery

## 2021-04-08 ENCOUNTER — Other Ambulatory Visit (HOSPITAL_COMMUNITY): Payer: Self-pay

## 2021-04-08 NOTE — Telephone Encounter (Signed)
ED CM received call from patient and his mother concerning being discharged from [facility] ED yesterday and not being able to get his prescription filled.  Patient states he is unable to the prescription for VIMPAT .  ED CM Discussed MATCH program patient enrolled, ED CM contacted [facility]  Pharmacy but closed until tomorrow, patient instructed to return tomorrow to [facility] pharmacy in the morning ED CM will call in prescription in the morning.

## 2021-04-09 ENCOUNTER — Other Ambulatory Visit (HOSPITAL_COMMUNITY): Payer: Self-pay

## 2021-04-09 ENCOUNTER — Telehealth (HOSPITAL_COMMUNITY): Payer: Self-pay | Admitting: Emergency Medicine

## 2021-04-09 DIAGNOSIS — G40909 Epilepsy, unspecified, not intractable, without status epilepticus: Secondary | ICD-10-CM

## 2021-04-09 MED ORDER — LACOSAMIDE 100 MG PO TABS
100.0000 mg | ORAL_TABLET | Freq: Two times a day (BID) | ORAL | 0 refills | Status: DC
Start: 2021-04-09 — End: 2021-05-04
  Filled 2021-04-09: qty 60, 30d supply, fill #0

## 2021-04-09 NOTE — Telephone Encounter (Signed)
Script sent not at pharmacy, asking for resend.

## 2021-04-27 ENCOUNTER — Ambulatory Visit: Payer: Self-pay | Admitting: Diagnostic Neuroimaging

## 2021-04-30 ENCOUNTER — Telehealth: Payer: Self-pay | Admitting: *Deleted

## 2021-05-04 ENCOUNTER — Ambulatory Visit: Payer: BC Managed Care – PPO | Admitting: Diagnostic Neuroimaging

## 2021-05-04 ENCOUNTER — Encounter: Payer: Self-pay | Admitting: Diagnostic Neuroimaging

## 2021-05-04 ENCOUNTER — Other Ambulatory Visit: Payer: Self-pay | Admitting: *Deleted

## 2021-05-04 VITALS — BP 136/90 | HR 72 | Ht 74.0 in | Wt 176.6 lb

## 2021-05-04 DIAGNOSIS — G40909 Epilepsy, unspecified, not intractable, without status epilepticus: Secondary | ICD-10-CM

## 2021-05-04 MED ORDER — LACOSAMIDE 100 MG PO TABS: 100.0000 mg | ORAL_TABLET | Freq: Two times a day (BID) | ORAL | 12 refills | Status: DC

## 2021-05-04 MED ORDER — LEVETIRACETAM 750 MG PO TABS: 1500.0000 mg | ORAL_TABLET | Freq: Two times a day (BID) | ORAL | 4 refills | Status: DC
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
## 2021-05-04 NOTE — Progress Notes (Addendum)
Per Dr  Marjory Lies gave patient 2 boxes of Vimpat 100 mg tabs (2 week supply) until he gets reply from Vimpat PAP application.  Lot [id number], exp 2/26. Rx printed for PAP, on MD desk for signature. Rx signed on Monday, application completed today with patients ID information that was missing and faxed to [facility] PAP.

## 2021-05-05 ENCOUNTER — Telehealth: Payer: Self-pay | Admitting: *Deleted

## 2021-05-05 NOTE — Telephone Encounter (Signed)
Received [place] DMV form, filled out, placed on MD desk for review, completion, signature. Of note, MD out of office until 05/11/21.

## 2021-05-06 ENCOUNTER — Encounter: Payer: Self-pay | Admitting: *Deleted

## 2021-05-11 NOTE — Telephone Encounter (Signed)
[place] DMV forms completed, signed and sent to medical records for processing. 

## 2021-05-12 ENCOUNTER — Telehealth: Payer: Self-pay | Admitting: *Deleted

## 2021-05-12 NOTE — Telephone Encounter (Signed)
Pt form faxed to dmv on 05/12/21

## 2021-05-14 ENCOUNTER — Telehealth: Payer: Self-pay | Admitting: *Deleted

## 2021-05-14 ENCOUNTER — Other Ambulatory Visit: Payer: Self-pay

## 2021-05-14 ENCOUNTER — Encounter (INDEPENDENT_AMBULATORY_CARE_PROVIDER_SITE_OTHER): Payer: Self-pay | Admitting: Primary Care

## 2021-05-14 ENCOUNTER — Ambulatory Visit (INDEPENDENT_AMBULATORY_CARE_PROVIDER_SITE_OTHER): Payer: Self-pay | Admitting: Primary Care

## 2021-05-14 ENCOUNTER — Encounter: Payer: Self-pay | Admitting: *Deleted

## 2021-05-14 VITALS — BP 142/86 | HR 75 | Temp 97.5°F | Resp 16 | Wt 175.0 lb

## 2021-05-14 DIAGNOSIS — Z79899 Other long term (current) drug therapy: Secondary | ICD-10-CM

## 2021-05-14 DIAGNOSIS — R569 Unspecified convulsions: Secondary | ICD-10-CM

## 2021-05-14 MED ORDER — LEVETIRACETAM 750 MG PO TABS
750.0000 mg | ORAL_TABLET | Freq: Two times a day (BID) | ORAL | 0 refills | Status: DC
  Filled 2021-05-14 – 2021-05-15 (×2): qty 60, 30d supply, fill #0

## 2021-05-14 MED ORDER — LACOSAMIDE 100 MG PO TABS
100.0000 mg | ORAL_TABLET | Freq: Two times a day (BID) | ORAL | 0 refills | Status: DC
  Filled 2021-05-14 – 2021-05-15 (×3): qty 60, 30d supply, fill #0

## 2021-05-14 NOTE — Patient Instructions (Signed)
According to [facility] the patient is supposed to call [facility] to schedule shipment, so they already have a script for Vimpat, he needs to call [phone]

## 2021-05-14 NOTE — Telephone Encounter (Signed)
Called Gwinda Passe, NP; she was unavailable. Advised front desk of reason for call, requested call back. Advised we do have Vimpat samples, will need work in MD approval to give to patient. Front desk sent message to NP.

## 2021-05-14 NOTE — Telephone Encounter (Signed)
[facility]) called, do you have any samples of Vimpat. Would like a call from the nurse.
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
## 2021-05-14 NOTE — Telephone Encounter (Signed)
Gwinda Passe NP sent Rx for lacosamide to [facility] today for 1 month supply.

## 2021-05-14 NOTE — Telephone Encounter (Signed)
Called [employer] P[employer] support line to check status of application faxed on 05/05/21 for Vimpat PAP. Spoke with [place] who stated case has been approved 05/08/21 to 05/09/2023. They reached out to patient to set up shipment, asked for a call back to schedule it.  Sent my chart and advised he call [employer] P[employer] to schedule shipment of his Vimpat.

## 2021-05-14 NOTE — Progress Notes (Signed)
Medication refill

## 2021-05-15 ENCOUNTER — Other Ambulatory Visit: Payer: Self-pay

## 2021-05-18 ENCOUNTER — Telehealth (INDEPENDENT_AMBULATORY_CARE_PROVIDER_SITE_OTHER): Payer: Self-pay

## 2021-05-18 NOTE — Telephone Encounter (Signed)
Copied from CRM [id number]. Topic: General - Other >> May 14, 2021  1:35 PM Jaquita Rector A wrote: Reason for CRM: Dreama Saa RN called in for Ms Randa Evens say that yes they do have the samples of Vimpat  100 mg so please call Ph# [phone]

## 2021-05-19 ENCOUNTER — Other Ambulatory Visit: Payer: Self-pay

## 2021-05-19 LAB — CMP14+EGFR
ALT: 23 IU/L (ref 0–44)
AST: 14 IU/L (ref 0–40)
Albumin/Globulin Ratio: 2.7 — ABNORMAL HIGH (ref 1.2–2.2)
Albumin: 5.1 g/dL — ABNORMAL HIGH (ref 4.0–5.0)
Alkaline Phosphatase: 105 IU/L (ref 44–121)
BUN/Creatinine Ratio: 6 — ABNORMAL LOW (ref 9–20)
BUN: 7 mg/dL (ref 6–24)
Bilirubin Total: 0.4 mg/dL (ref 0.0–1.2)
CO2: 24 mmol/L (ref 20–29)
Calcium: 9.9 mg/dL (ref 8.7–10.2)
Chloride: 102 mmol/L (ref 96–106)
Creatinine, Ser: 1.12 mg/dL (ref 0.76–1.27)
Globulin, Total: 1.9 g/dL (ref 1.5–4.5)
Glucose: 92 mg/dL (ref 65–99)
Potassium: 4.4 mmol/L (ref 3.5–5.2)
Sodium: 141 mmol/L (ref 134–144)
Total Protein: 7 g/dL (ref 6.0–8.5)
eGFR: 82 mL/min/{1.73_m2} (ref >59)

## 2021-05-19 LAB — LEVETIRACETAM LEVEL: Levetiracetam Lvl: 43 ug/mL — ABNORMAL HIGH (ref 10.0–40.0)

## 2021-05-20 NOTE — Telephone Encounter (Addendum)
Pt has send the message on 05-16-2021 through mychart per mary. Pt has medication assistant program all he needs to do is call for shipment of vimpat and pt is aware per [facility]

## 2021-05-20 NOTE — Telephone Encounter (Signed)
Called Gwinda Passe, NP, spoke with Nelva Bush who stated NP's note stated she needs to know how he can get samples. I informed Nelva Bush  patient has PAP for vimpat, has been notified on 05/16/21 to call PAP to schedule shipment. Patient has seen my chart on 05/16/21. He should not need samples. Nelva Bush will pass message to NP.

## 2021-05-20 NOTE — Telephone Encounter (Signed)
Gwinda Passe NP requesting call back.

## 2021-05-20 NOTE — Telephone Encounter (Signed)
Called Mary left message with secretary to find out how to obtain Vimpat samples.

## 2021-05-22 ENCOUNTER — Other Ambulatory Visit: Payer: Self-pay
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
## 2021-06-01 NOTE — Telephone Encounter (Signed)
Stanton Kidney S refaxed [place] DMV forms today and called patient to advise him.

## 2021-07-02 ENCOUNTER — Other Ambulatory Visit: Payer: Self-pay

## 2021-07-02 ENCOUNTER — Ambulatory Visit (INDEPENDENT_AMBULATORY_CARE_PROVIDER_SITE_OTHER): Payer: Self-pay | Admitting: Family

## 2021-07-02 ENCOUNTER — Encounter (INDEPENDENT_AMBULATORY_CARE_PROVIDER_SITE_OTHER): Payer: Self-pay | Admitting: Family

## 2021-07-02 VITALS — BP 151/91 | HR 69 | Temp 97.5°F | Ht 74.0 in | Wt 174.8 lb

## 2021-07-02 DIAGNOSIS — I10 Essential (primary) hypertension: Secondary | ICD-10-CM

## 2021-07-02 DIAGNOSIS — Z131 Encounter for screening for diabetes mellitus: Secondary | ICD-10-CM

## 2021-07-02 LAB — POCT GLYCOSYLATED HEMOGLOBIN (HGB A1C): Hemoglobin A1C: 5.8 % — AB (ref 4.0–5.6)

## 2021-07-02 MED ORDER — LISINOPRIL 10 MG PO TABS: 10.0000 mg | ORAL_TABLET | Freq: Every day | ORAL | 3 refills | Status: DC

## 2021-07-02 NOTE — Progress Notes (Signed)
Reginald Ray, is a 45 y.o. male  PRF:[id number]  DJT:[id number]  DOB - November 11, 1976  Subjective:  Chief Complaint and HPI: Reginald Ray is a 45 y.o. male who presented to the clinic this morning for blood pressure check.  During his last visit his blood pressure was found to be elevated, also he is requesting to have his A1c checked because November of last year he was told that he was prediabetic.  There was no symptoms today.   ED/Hospital notes reviewed.     ROS:   Constitutional:  No f/c, No night sweats, No unexplained weight loss. EENT:  No vision changes, No blurry vision, No hearing changes. No mouth, throat, or ear problems.  Respiratory: No cough, No SOB Cardiac: No CP, no palpitations GI:  No abd pain, No N/V/D. GU: No Urinary s/sx Neuro: No headache, no dizziness, no motor weakness.  Endocrine:  No polydipsia. No polyuria.  Psych: Denies SI/HI  No problems updated.  ALLERGIES: No Known Allergies  PAST MEDICAL HISTORY: Past Medical History:  Diagnosis Date   Seizure ([place]) 1997; 12/13/2017 X 2, 04/07/21   sz 04/16/20   Seizures ([place])    Substance abuse ([place])     MEDICATIONS AT HOME: Prior to Admission medications   Medication Sig Start Date End Date Taking? Authorizing Provider  Lacosamide 100 MG TABS Take 1 tablet (100 mg total) by mouth 2 (two) times daily. 05/04/21  Yes Penumalli, Earlean Polka, MD  levETIRAcetam (KEPPRA) 750 MG tablet Take 2 tablets (1,500 mg total) by mouth 2 (two) times daily. 05/04/21  Yes Penumalli, Earlean Polka, MD  lisinopril (ZESTRIL) 10 MG tablet Take 1 tablet (10 mg total) by mouth daily. 07/02/21  Yes Feliberto Gottron, FNP  acetaminophen (TYLENOL) 500 MG tablet Take 1,000 mg by mouth every 6 (six) hours as needed for mild pain.    [provider]     Objective:  EXAM:   Vitals:   07/02/21 1100 07/02/21 1117  BP: (!) 153/96 (!) 151/91  Pulse: 66 69  Temp: (!) 97.5 F (36.4 C)   TempSrc: Temporal   SpO2: 98%   Weight:  174 lb 12.8 oz (79.3 kg)   Height: _0  (1.88 m)     General appearance : A&OX3. NAD. Non-toxic-appearing HEENT: Atraumatic and Normocephalic.  PERRLA. EOM intact.  TM clear B. Mouth-MMM, post pharynx WNL w/o erythema, No PND. Neck: supple, no JVD. No cervical lymphadenopathy. No thyromegaly Chest/Lungs:  Breathing-non-labored, Good air entry bilaterally, breath sounds normal without rales, rhonchi, or wheezing  CVS: S1 S2 regular, no murmurs, gallops, rubs  Neurology:  CN II-XII grossly intact, Non focal.   Psych:  TP linear. J/I WNL. Normal speech. Appropriate eye contact and affect.   Data Review Lab Results  Component Value Date   HGBA1C 5.8 (H) 10/16/2019     Assessment & Plan   1. Essential hypertension -Increase physical activities -Cut back on salt consumption - lisinopril (ZESTRIL) 10 MG tablet; Take 1 tablet (10 mg total) by mouth daily.  Dispense: 90 tablet; Refill: 3 -Follow-up in a month for hypertension 2. Diabetes mellitus screening -Eat plant-based diet and increase physical activities, avoid any simple sugar drinks/foods - CBC with Differential - CMP14+EGFR - HgB A1c     Patient have been counseled extensively about nutrition and exercise  Return in about 4 weeks (around 07/30/2021) for Hypertension.  The patient was given clear instructions to go to ER or return to medical center if symptoms don't improve, worsen or  new problems develop. The patient verbalized understanding. The patient was told to call to get lab results if they haven't heard anything in the next week.     Feliberto Gottron, APRN, FNP-C [facility] [place], [place]   07/02/2021, 11:27 AM

## 2021-07-02 NOTE — Patient Instructions (Signed)
Take BP medications as prescribed  Follow up in 1 month for HTN Increase physical activities Eat plant based diet

## 2021-07-03 LAB — CMP14+EGFR
ALT: 26 IU/L (ref 0–44)
AST: 19 IU/L (ref 0–40)
Albumin/Globulin Ratio: 2.4 — ABNORMAL HIGH (ref 1.2–2.2)
Albumin: 4.4 g/dL (ref 4.0–5.0)
Alkaline Phosphatase: 102 IU/L (ref 44–121)
BUN/Creatinine Ratio: 9 (ref 9–20)
BUN: 8 mg/dL (ref 6–24)
Bilirubin Total: 0.2 mg/dL (ref 0.0–1.2)
CO2: 24 mmol/L (ref 20–29)
Calcium: 9.4 mg/dL (ref 8.7–10.2)
Chloride: 104 mmol/L (ref 96–106)
Creatinine, Ser: 0.94 mg/dL (ref 0.76–1.27)
Globulin, Total: 1.8 g/dL (ref 1.5–4.5)
Glucose: 110 mg/dL — ABNORMAL HIGH (ref 65–99)
Potassium: 3.5 mmol/L (ref 3.5–5.2)
Sodium: 142 mmol/L (ref 134–144)
Total Protein: 6.2 g/dL (ref 6.0–8.5)
eGFR: 101 mL/min/{1.73_m2} (ref >59)

## 2021-07-03 LAB — CBC WITH DIFFERENTIAL/PLATELET
Basophils Absolute: 0 10*3/uL (ref 0.0–0.2)
Basos: 1 %
EOS (ABSOLUTE): 0.1 10*3/uL (ref 0.0–0.4)
Eos: 2 %
Hematocrit: 40.5 % (ref 37.5–51.0)
Hemoglobin: 13.5 g/dL (ref 13.0–17.7)
Immature Grans (Abs): 0 10*3/uL (ref 0.0–0.1)
Immature Granulocytes: 0 %
Lymphocytes Absolute: 1.5 10*3/uL (ref 0.7–3.1)
Lymphs: 36 %
MCH: 29.8 pg (ref 26.6–33.0)
MCHC: 33.3 g/dL (ref 31.5–35.7)
MCV: 89 fL (ref 79–97)
Monocytes Absolute: 0.3 10*3/uL (ref 0.1–0.9)
Monocytes: 8 %
Neutrophils Absolute: 2.3 10*3/uL (ref 1.4–7.0)
Neutrophils: 53 %
Platelets: 361 10*3/uL (ref 150–450)
RBC: 4.53 x10E6/uL (ref 4.14–5.80)
RDW: 13.4 % (ref 11.6–15.4)
WBC: 4.2 10*3/uL (ref 3.4–10.8)

## 2021-07-19 ENCOUNTER — Other Ambulatory Visit: Payer: Self-pay

## 2021-07-19 ENCOUNTER — Emergency Department (HOSPITAL_COMMUNITY)
Admission: EM | Admit: 2021-07-19 | Discharge: 2021-07-19 | Disposition: A | Discharge status: Home / Self Care | Payer: Self-pay | Attending: Emergency Medicine | Admitting: Emergency Medicine

## 2021-07-19 ENCOUNTER — Encounter (HOSPITAL_COMMUNITY): Payer: Self-pay

## 2021-07-19 DIAGNOSIS — Z79899 Other long term (current) drug therapy: Secondary | ICD-10-CM | POA: Insufficient documentation

## 2021-07-19 DIAGNOSIS — Z87891 Personal history of nicotine dependence: Secondary | ICD-10-CM | POA: Insufficient documentation

## 2021-07-19 DIAGNOSIS — G40909 Epilepsy, unspecified, not intractable, without status epilepticus: Secondary | ICD-10-CM | POA: Insufficient documentation

## 2021-07-19 NOTE — Discharge Instructions (Addendum)
Continue taking home medications.  Follow-up with neurology.

## 2021-07-19 NOTE — ED Provider Notes (Signed)
[facility]-EMERGENCY DEPT Provider Note   CSN: [id number] Arrival date & time: 07/19/21  1016     History Chief Complaint  Patient presents with   Seizures    Reginald Ray is a 45 y.o. male.  Patient had a witnessed seizure at home.  Short lasting.  Back at his baseline.  Took Keppra prior to leaving with EMS.  Denies any pain or headaches.  Did not fall during the event.  States that he was lying down in his bed.  No extremity pain.  The history is provided by the patient.  Seizures Seizure activity on arrival: no   Postictal symptoms: confusion   Return to baseline: yes   Severity:  Mild Timing:  Once Recent head injury:  No recent head injuries History of seizures: yes       Past Medical History:  Diagnosis Date   Seizure ([facility]) 1997; 12/13/2017 X 2, 04/07/21   sz 04/16/20   Seizures ([facility])    Substance abuse [facility])     Patient Active Problem List   Diagnosis Date Noted   Seizure (HCC) 12/13/2017   Rhabdomyolysis 12/13/2017   Hypokalemia 12/13/2017    Past Surgical History:  Procedure Laterality Date   NO PAST SURGERIES         Family History  Problem Relation Age of Onset   Hypertension Mother    Hypertension Father     Social History   Tobacco Use   Smoking status: Former    Packs/day: 1.00    Years: 10.00    Pack years: 10.00    Types: Cigarettes    Quit date: 02/29/2008    Years since quitting: 13.3   Smokeless tobacco: Never  Vaping Use   Vaping Use: Never used  Substance Use Topics   Alcohol use: Yes    Alcohol/week: 5.0 standard drinks    Types: 5 Cans of beer per week    Comment: none since Aug 2020   Drug use: Yes    Types: Marijuana    Comment: 04/28/20 "every day"    Home Medications Prior to Admission medications   Medication Sig Start Date End Date Taking? Authorizing Provider  acetaminophen (TYLENOL) 500 MG tablet Take 1,000 mg by mouth every 6 (six) hours as needed for mild pain.    [provider]  Lacosamide 100 MG TABS Take 1 tablet (100 mg total) by mouth 2 (two) times daily. 05/04/21   Penumalli, Glenford Bayley, MD  levETIRAcetam (KEPPRA) 750 MG tablet Take 2 tablets (1,500 mg total) by mouth 2 (two) times daily. 05/04/21   Penumalli, Glenford Bayley, MD  lisinopril (ZESTRIL) 10 MG tablet Take 1 tablet (10 mg total) by mouth daily. 07/02/21   Eleonore Chiquito, FNP    Allergies    Patient has no known allergies.  Review of Systems   Review of Systems  Constitutional:  Negative for chills and fever.  HENT:  Negative for ear pain and sore throat.   Eyes:  Negative for pain and visual disturbance.  Respiratory:  Negative for cough and shortness of breath.   Cardiovascular:  Negative for chest pain and palpitations.  Gastrointestinal:  Negative for abdominal pain and vomiting.  Genitourinary:  Negative for dysuria and hematuria.  Musculoskeletal:  Negative for arthralgias and back pain.  Skin:  Negative for color change and rash.  Neurological:  Positive for seizures. Negative for syncope.  All other systems reviewed and are negative.  Physical Exam Updated Vital Signs BP 124/81 (  BP Location: Left Arm)   Pulse 84   Temp 97.6 F (36.4 C) (Oral)   Resp 16   SpO2 100%   Physical Exam Vitals and nursing note reviewed.  Constitutional:      Appearance: He is well-developed.  HENT:     Head: Normocephalic and atraumatic.  Eyes:     Extraocular Movements: Extraocular movements intact.     Conjunctiva/sclera: Conjunctivae normal.     Pupils: Pupils are equal, round, and reactive to light.  Cardiovascular:     Rate and Rhythm: Normal rate and regular rhythm.     Pulses: Normal pulses.     Heart sounds: Normal heart sounds. No murmur heard. Pulmonary:     Effort: Pulmonary effort is normal. No respiratory distress.     Breath sounds: Normal breath sounds.  Abdominal:     Palpations: Abdomen is soft.     Tenderness: There is no abdominal tenderness.  Musculoskeletal:         General: No tenderness. Normal range of motion.     Cervical back: Neck supple.  Skin:    General: Skin is warm and dry.  Neurological:     General: No focal deficit present.     Mental Status: He is alert and oriented to person, place, and time.     Cranial Nerves: No cranial nerve deficit.     Sensory: No sensory deficit.     Motor: No weakness.     Coordination: Coordination normal.     Gait: Gait normal.    ED Results / Procedures / Treatments   Labs (all labs ordered are listed, but only abnormal results are displayed) Labs Reviewed - No data to display  EKG None  Radiology No results found.  Procedures Procedures   Medications Ordered in ED Medications - No data to display  ED Course  I have reviewed the triage vital signs and the nursing notes.  Pertinent labs & imaging results that were available during my care of the patient were reviewed by me and considered in my medical decision making (see chart for details).    MDM Rules/Calculators/A&P                           Reginald Ray is here after seizure.  Normal vitals.  No fever.  Very well-appearing.  Back at his baseline.  States that he has been compliant with his medications.  Denies any recent alcohol or drug use.  Neurologically is intact.  He is not having any bony tenderness.  He had a witnessed short seizure at home.  He states that he was in his bed when it happens.  He did not hit his head and there is no signs of external trauma.  He has active prescriptions of his seizure medications.  He follows with neurology.  He states that he has a seizure every several months.  Recommend follow-up to discuss if any need for medication changes.  Discharged in good condition.  This chart was dictated using voice recognition software.  Despite best efforts to proofread,  errors can occur which can change the documentation meaning.   Final Clinical Impression(s) / ED Diagnoses Final diagnoses:  Seizure  disorder [facility])    Rx / DC Orders ED Discharge Orders     None        Virgina Norfolk, DO 07/19/21 1126

## 2021-07-19 NOTE — ED Triage Notes (Signed)
Pt BIB EMS from motel. Pt had a witnessed 15 minute seizure prior to arrival. Pt takes medication for seizures. Upon EMS arrival pt was A&O x4. Pt is combative when post-ictal.   20G LFA 140/84 HR 84 CBG 153

## 2021-08-05 ENCOUNTER — Ambulatory Visit (INDEPENDENT_AMBULATORY_CARE_PROVIDER_SITE_OTHER): Payer: BC Managed Care – PPO | Admitting: Primary Care

## 2021-08-21 ENCOUNTER — Other Ambulatory Visit: Payer: Self-pay

## 2021-08-21 ENCOUNTER — Ambulatory Visit (INDEPENDENT_AMBULATORY_CARE_PROVIDER_SITE_OTHER): Payer: BC Managed Care – PPO | Admitting: Primary Care

## 2021-08-21 ENCOUNTER — Encounter (INDEPENDENT_AMBULATORY_CARE_PROVIDER_SITE_OTHER): Payer: Self-pay | Admitting: Primary Care

## 2021-08-21 VITALS — BP 134/82 | HR 59 | Temp 97.5°F | Ht 74.0 in | Wt 163.0 lb

## 2021-08-21 DIAGNOSIS — I10 Essential (primary) hypertension: Secondary | ICD-10-CM

## 2021-08-21 DIAGNOSIS — Z1211 Encounter for screening for malignant neoplasm of colon: Secondary | ICD-10-CM

## 2021-08-21 DIAGNOSIS — R569 Unspecified convulsions: Secondary | ICD-10-CM | POA: Diagnosis not present

## 2021-08-21 NOTE — Progress Notes (Signed)
[facility]   Reginald Ray is a 45 y.o. male presents for hypertension evaluation, Denies shortness of breath, headaches, chest pain or lower extremity edema, sudden onset, vision changes, unilateral weakness, dizziness, paresthesias   Patient reports adherence with medications.  Dietary habits include: monitoring sodium intake  Exercise habits include:yes walking Family / Social history: Mother Heart problems   89-  Past Medical History:  Diagnosis Date   Seizure (HCC) 1997; 12/13/2017 X 2, 04/07/21   sz 04/16/20   Seizures ([facility])    Substance abuse ([facility])    Past Surgical History:  Procedure Laterality Date   NO PAST SURGERIES     No Known Allergies Current Outpatient Medications on File Prior to Visit  Medication Sig Dispense Refill   acetaminophen (TYLENOL) 500 MG tablet Take 1,000 mg by mouth every 6 (six) hours as needed for mild pain.     Lacosamide 100 MG TABS Take 1 tablet (100 mg total) by mouth 2 (two) times daily. 60 tablet 12   levETIRAcetam (KEPPRA) 750 MG tablet Take 2 tablets (1,500 mg total) by mouth 2 (two) times daily. 360 tablet 4   lisinopril (ZESTRIL) 10 MG tablet Take 1 tablet (10 mg total) by mouth daily. 90 tablet 3   No current facility-administered medications on file prior to visit.   Social History   Socioeconomic History   Marital status: Divorced    Spouse name: Not on file   Number of children: Not on file   Years of education: 10   Highest education level: Not on file  Occupational History    Comment: night shift  Tobacco Use   Smoking status: Former    Packs/day: 1.00    Years: 10.00    Pack years: 10.00    Types: Cigarettes    Quit date: 02/29/2008    Years since quitting: 13.4   Smokeless tobacco: Never  Vaping Use   Vaping Use: Never used  Substance and Sexual Activity   Alcohol use: Yes    Alcohol/week: 5.0 standard drinks    Types: 5 Cans of beer per week    Comment: none since Aug 2020   Drug use: Yes     Types: Marijuana    Comment: 04/28/20 "every day"   Sexual activity: Not Currently  Other Topics Concern   Not on file  Social History Narrative   Lives with fiancee, Reginald Ray   Caffeine- ginger ale, coffee- 12 oz daily   [employer]- loads [employer]   10th grade education   Social Determinants of Health   Financial Resource Strain: Not on file  Food Insecurity: Not on file  Transportation Needs: Not on file  Physical Activity: Not on file  Stress: Not on file  Social Connections: Not on file  Intimate Partner Violence: Not on file   Family History  Problem Relation Age of Onset   Hypertension Mother    Hypertension Father      OBJECTIVE:  Vitals:   08/21/21 1115  BP: 134/82  Pulse: (!) 59  Temp: (!) 97.5 F (36.4 C)  TempSrc: Temporal  SpO2: 100%  Weight: 163 lb (73.9 kg)  Height: 6\' 2"  (1.88 m)    Physical Exam Vitals reviewed.  HENT:     Head: Normocephalic.     Right Ear: Tympanic membrane normal.     Left Ear: Tympanic membrane normal.     Nose: Nose normal.  Eyes:     Extraocular Movements: Extraocular movements intact.  Cardiovascular:  Rate and Rhythm: Normal rate and regular rhythm.  Pulmonary:     Effort: Pulmonary effort is normal.     Breath sounds: Normal breath sounds.  Abdominal:     General: Bowel sounds are normal.     Palpations: Abdomen is soft.  Musculoskeletal:        General: Normal range of motion.     Cervical back: Normal range of motion.  Skin:    General: Skin is warm and dry.  Neurological:     Mental Status: He is alert and oriented to person, place, and time.  Psychiatric:        Mood and Affect: Mood normal.        Behavior: Behavior normal.        Thought Content: Thought content normal.        Judgment: Judgment normal.    Review of Systems  Neurological:  Positive for seizures.       Sept - sleep woke up in the ambulance - ask to f/u with neurologist   All other systems reviewed and are negative.  Last 3 Office BP  readings: BP Readings from Last 3 Encounters:  08/21/21 134/82  07/19/21 120/70  07/02/21 (!) 151/91    BMET    Component Value Date/Time   NA 142 07/02/2021 1133   K 3.5 07/02/2021 1133   CL 104 07/02/2021 1133   CO2 24 07/02/2021 1133   GLUCOSE 110 (H) 07/02/2021 1133   GLUCOSE 184 (H) 04/16/2020 0424   BUN 8 07/02/2021 1133   CREATININE 0.94 07/02/2021 1133   CALCIUM 9.4 07/02/2021 1133   GFRNONAA >60 04/16/2020 0424   GFRAA >60 04/16/2020 0424    Renal function: CrCl cannot be calculated (Patient's most recent lab result is older than the maximum 21 days allowed.).  Clinical ASCVD: No  The ASCVD Risk score (Arnett DK, et al., 2019) failed to calculate for the following reasons:   Cannot find a previous HDL lab   Cannot find a previous total cholesterol lab  ASCVD risk factors include- Reginald  Ray was seen today for hypertension.  Diagnoses and all orders for this visit:  Essential hypertension   -Counseled on lifestyle modifications for blood pressure control including reduced dietary sodium, increased exercise, weight reduction and adequate sleep. Also, educated patient about the risk for cardiovascular events, stroke and heart attack. Also counseled patient about the importance of medication adherence. If you participate in smoking, it is important to stop using tobacco as this will increase the risks associated with uncontrolled blood pressure.   -Hypertension longstanding diagnosed currently lisinopril 10 mg daily on current medications. Patient is adherent with current medications.   Goal BP:  For patients younger than 60: Goal BP < 130/80. For patients 60 and older: Goal BP < 140/90. For patients with diabetes: Goal BP < 130/80. Your most recent BP: 134/82  Minimize salt intake. Minimize alcohol intake   Seizure (HCC) Followed by neurology currently on Vimpat and Keppra.  Patient is aware PCP does not manage refill or prescribe medications for  seizures  Colon cancer screening -     Ambulatory referral to Gastroenterology  This note has been created with Dragon speech recognition software and smart Lobbyist. Any transcriptional errors are unintentional.   Reginald Sessions, NP 08/21/2021, 11:26 AM

## 2021-08-21 NOTE — Patient Instructions (Signed)

## 2021-11-15 ENCOUNTER — Other Ambulatory Visit: Payer: Self-pay | Admitting: Diagnostic Neuroimaging

## 2021-11-15 DIAGNOSIS — G40909 Epilepsy, unspecified, not intractable, without status epilepticus: Secondary | ICD-10-CM

## 2021-11-18 ENCOUNTER — Other Ambulatory Visit: Payer: Self-pay | Admitting: Diagnostic Neuroimaging

## 2021-11-18 ENCOUNTER — Telehealth: Payer: Self-pay | Admitting: Neurology

## 2021-11-18 DIAGNOSIS — G40909 Epilepsy, unspecified, not intractable, without status epilepticus: Secondary | ICD-10-CM

## 2021-11-18 MED ORDER — LACOSAMIDE 100 MG PO TABS: 100.0000 mg | ORAL_TABLET | Freq: Two times a day (BID) | ORAL | 0 refills | Status: DC

## 2021-11-18 MED ORDER — LACOSAMIDE 100 MG PO TABS: 100.0000 mg | ORAL_TABLET | Freq: Two times a day (BID) | ORAL | 12 refills | Status: DC

## 2021-11-18 NOTE — Telephone Encounter (Signed)
[place] drug registry has been verified. Last refill was 10/19/21 # 60 for a 30 day supply.  Will fwd to the work in doctor since Dr. Marjory Lies is out of the office.

## 2021-11-18 NOTE — Telephone Encounter (Signed)
Pt is needing a refill request for his Lacosamide 100 MG TABS [facility] [address]

## 2021-11-18 NOTE — Telephone Encounter (Signed)
Pt has called to inform that [facility] [phone]  states a new Rx is needed for the Lacosamide 100 MG TABS because the one that they have has expired

## 2021-11-18 NOTE — Telephone Encounter (Signed)
Refill sent in today

## 2021-11-18 NOTE — Telephone Encounter (Signed)
Pt requesting refill for Lacosamide 100 MG TABS. Pharmacy [id number] - [place],  - [address]

## 2021-11-18 NOTE — Telephone Encounter (Signed)
Pt called stating that the pharmacy has not received the prescription for his seizure medication.

## 2021-11-18 NOTE — Telephone Encounter (Signed)
I called the patient to let him know the [facility] has now [facility]

## 2021-11-24 ENCOUNTER — Ambulatory Visit (INDEPENDENT_AMBULATORY_CARE_PROVIDER_SITE_OTHER): Payer: BC Managed Care – PPO | Admitting: Primary Care

## 2021-12-15 ENCOUNTER — Other Ambulatory Visit: Payer: Self-pay | Admitting: Neurology

## 2021-12-15 DIAGNOSIS — G40909 Epilepsy, unspecified, not intractable, without status epilepticus: Secondary | ICD-10-CM

## 2021-12-15 NOTE — Telephone Encounter (Signed)
Patient is due for a refill on lacosamide.  Patient has an upcoming appointment in May 2023 with Amy, NP, which is 11 months after his last appointment with [place], when a 6 month follow up was recommended.  [place] Controlled Substance Registry checked and is appropriate.  I will send to Dr. Leta Baptist for further review.

## 2021-12-15 NOTE — Telephone Encounter (Signed)
Meds ordered this encounter  Medications   Lacosamide 100 MG TABS    Sig: TAKE 1 TABLET BY MOUTH TWICE A DAY    Dispense:  60 tablet    Refill:  3    Not to exceed 5 additional fills before 05/17/2022   Suanne Marker, MD 12/15/2021, 1:17 PM Certified in Neurology, Neurophysiology and Neuroimaging  [facility] [address], [address] [place], [place] [address] [phone]

## 2021-12-23 ENCOUNTER — Emergency Department (HOSPITAL_COMMUNITY): Payer: BC Managed Care – PPO

## 2021-12-23 ENCOUNTER — Other Ambulatory Visit: Payer: Self-pay

## 2021-12-23 ENCOUNTER — Encounter (HOSPITAL_COMMUNITY): Payer: Self-pay | Admitting: Radiology

## 2021-12-23 ENCOUNTER — Emergency Department (HOSPITAL_COMMUNITY)
Admission: EM | Admit: 2021-12-23 | Discharge: 2021-12-23 | Disposition: A | Discharge status: Home / Self Care | Payer: BC Managed Care – PPO | Attending: Emergency Medicine | Admitting: Emergency Medicine

## 2021-12-23 DIAGNOSIS — R569 Unspecified convulsions: Secondary | ICD-10-CM | POA: Diagnosis not present

## 2021-12-23 DIAGNOSIS — M1612 Unilateral primary osteoarthritis, left hip: Secondary | ICD-10-CM | POA: Insufficient documentation

## 2021-12-23 DIAGNOSIS — Z79899 Other long term (current) drug therapy: Secondary | ICD-10-CM | POA: Diagnosis not present

## 2021-12-23 DIAGNOSIS — M25551 Pain in right hip: Secondary | ICD-10-CM | POA: Diagnosis not present

## 2021-12-23 LAB — CBC WITH DIFFERENTIAL/PLATELET
Abs Immature Granulocytes: 0.02 10*3/uL (ref 0.00–0.07)
Basophils Absolute: 0 10*3/uL (ref 0.0–0.1)
Basophils Relative: 0 %
Eosinophils Absolute: 0 10*3/uL (ref 0.0–0.5)
Eosinophils Relative: 0 %
HCT: 41.3 % (ref 39.0–52.0)
Hemoglobin: 14.2 g/dL (ref 13.0–17.0)
Immature Granulocytes: 0 %
Lymphocytes Relative: 16 %
Lymphs Abs: 1.3 10*3/uL (ref 0.7–4.0)
MCH: 31.6 pg (ref 26.0–34.0)
MCHC: 34.4 g/dL (ref 30.0–36.0)
MCV: 91.8 fL (ref 80.0–100.0)
Monocytes Absolute: 0.6 10*3/uL (ref 0.1–1.0)
Monocytes Relative: 7 %
Neutro Abs: 6.4 10*3/uL (ref 1.7–7.7)
Neutrophils Relative %: 77 %
Platelets: 342 10*3/uL (ref 150–400)
RBC: 4.5 MIL/uL (ref 4.22–5.81)
RDW: 13.6 % (ref 11.5–15.5)
WBC: 8.4 10*3/uL (ref 4.0–10.5)
nRBC: 0 % (ref 0.0–0.2)

## 2021-12-23 LAB — COMPREHENSIVE METABOLIC PANEL
ALT: 28 U/L (ref 0–44)
AST: 33 U/L (ref 15–41)
Albumin: 4.9 g/dL (ref 3.5–5.0)
Alkaline Phosphatase: 89 U/L (ref 38–126)
Anion gap: 9 (ref 5–15)
BUN: 7 mg/dL (ref 6–20)
CO2: 26 mmol/L (ref 22–32)
Calcium: 9.8 mg/dL (ref 8.9–10.3)
Chloride: 101 mmol/L (ref 98–111)
Creatinine, Ser: 1.03 mg/dL (ref 0.61–1.24)
GFR, Estimated: >60 mL/min (ref >60)
Glucose, Bld: 119 mg/dL — ABNORMAL HIGH (ref 70–99)
Potassium: 3.8 mmol/L (ref 3.5–5.1)
Sodium: 136 mmol/L (ref 135–145)
Total Bilirubin: 1.1 mg/dL (ref 0.3–1.2)
Total Protein: 7.7 g/dL (ref 6.5–8.1)

## 2021-12-23 LAB — URINALYSIS, ROUTINE W REFLEX MICROSCOPIC
Bilirubin Urine: NEGATIVE
Glucose, UA: NEGATIVE mg/dL
Hgb urine dipstick: NEGATIVE
Ketones, ur: NEGATIVE mg/dL
Leukocytes,Ua: NEGATIVE
Nitrite: NEGATIVE
Protein, ur: NEGATIVE mg/dL
Specific Gravity, Urine: <1.005 — ABNORMAL LOW (ref 1.005–1.030)
pH: 7 (ref 5.0–8.0)

## 2021-12-23 MED ORDER — LEVETIRACETAM IN NACL 1000 MG/100ML IV SOLN
1000.0000 mg | Freq: Once | INTRAVENOUS | Status: AC
  Administered 2021-12-23: 1000 mg via INTRAVENOUS
  Filled 2021-12-23: qty 100

## 2021-12-23 MED ORDER — NAPROXEN 500 MG PO TABS
500.0000 mg | ORAL_TABLET | Freq: Once | ORAL | Status: AC
  Administered 2021-12-23: 500 mg via ORAL
  Filled 2021-12-23: qty 1

## 2021-12-23 MED ORDER — FENTANYL CITRATE PF 50 MCG/ML IJ SOSY
100.0000 ug | PREFILLED_SYRINGE | Freq: Once | INTRAMUSCULAR | Status: AC
  Administered 2021-12-23: 100 ug via INTRAVENOUS
  Filled 2021-12-23: qty 2

## 2021-12-23 NOTE — ED Provider Triage Note (Signed)
Emergency Medicine Provider Triage Evaluation Note  Reginald Ray , a 46 y.o. male  was evaluated in triage.  Pt complains of possible seizure overnight.  He was alone but noticed he had pain and injuries and at home were moved around causing him to believe he had a seizure.  He reports his left hip is hurting to the point he is now unable to ambulate. He repots compliance with his seizure meds.   He took two tylenol about 1hour PTA.   Next dose of seizure meds due at 730 pm.   Physical Exam  BP (!) 151/118 (BP Location: Right Arm) Comment: Simultaneous filing. User may not have seen previous data.   Pulse 77 Comment: Simultaneous filing. User may not have seen previous data.   Temp 98.4 F (36.9 C) (Oral)    Resp 15 Comment: Simultaneous filing. User may not have seen previous data.   Ht 6\' 2"  (1.88 m)    Wt 77.1 kg    SpO2 97% Comment: Simultaneous filing. User may not have seen previous data.   BMI 21.83 kg/m  Gen:   Awake, no distress   Resp:  Normal effort  MSK:   Moves all extremities except left hip has pain with movement.  Other:  Sensation intact to light touch to left leg.   Medical Decision Making  Medically screening exam initiated at 12:39 PM.  Appropriate orders placed.  Reginald Ray was informed that the remainder of the evaluation will be completed by another provider, this initial triage assessment does not replace that evaluation, and the importance of remaining in the ED until their evaluation is complete.  Note: Portions of this report may have been transcribed using voice recognition software. Every effort was made to ensure accuracy; however, inadvertent computerized transcription errors may be present    Lady Saucier, PA-C 12/23/21 1246

## 2021-12-23 NOTE — ED Provider Notes (Signed)
[facility] DEPT Provider Note   CSN: [id number] Arrival date & time: 12/23/21  1204     History  Chief Complaint  Patient presents with   Seizures    Reginald Ray is a 46 y.o. male.  HPI     46 year old male comes in with chief complaint of seizures. He has history of seizure disorder for which he takes lacosamide and Keppra.  He indicates that the last seizure was about 6 months ago.  In general he is compliant with his medication, and does not get frequent seizures.  Patient indicates that he worked third shift last night, came home and probably seized.  He was tired and probably fell asleep thereafter.  When he woke up, he noted that he was unable to walk because of left hip pain.  He decided to come to the ER.  He also bit his tongue most likely.  He denies any substance use disorder, medication noncompliance, recent illnesses -but does indicate that he has more stress in his life, mostly domestic.  Home Medications Prior to Admission medications   Medication Sig Start Date End Date Taking? Authorizing Provider  acetaminophen (TYLENOL) 500 MG tablet Take 1,000 mg by mouth every 6 (six) hours as needed for mild pain.    [provider]  Lacosamide 100 MG TABS TAKE 1 TABLET BY MOUTH TWICE A DAY 12/15/21   Penumalli, Earlean Polka, MD  levETIRAcetam (KEPPRA) 750 MG tablet Take 2 tablets (1,500 mg total) by mouth 2 (two) times daily. 05/04/21   Penumalli, Earlean Polka, MD  lisinopril (ZESTRIL) 10 MG tablet Take 1 tablet (10 mg total) by mouth daily. 07/02/21   Feliberto Gottron, FNP      Allergies    Patient has no known allergies.    Review of Systems   Review of Systems  Constitutional:  Positive for activity change.  Neurological:  Positive for seizures.   Physical Exam Updated Vital Signs BP (!) 157/81    Pulse 94    Temp 99.4 F (37.4 C) (Oral)    Resp (!) 22    Ht 6\' 2"  (1.88 m)    Wt 77.1 kg    SpO2 100%    BMI 21.83 kg/m  Physical  Exam Vitals and nursing note reviewed.  Constitutional:      Appearance: He is well-developed.  HENT:     Head: Atraumatic.  Cardiovascular:     Rate and Rhythm: Normal rate.  Pulmonary:     Effort: Pulmonary effort is normal.  Musculoskeletal:     Cervical back: Neck supple.     Comments: Left proximal femur/hip tenderness  Skin:    General: Skin is warm.  Neurological:     Mental Status: He is alert and oriented to person, place, and time.    ED Results / Procedures / Treatments   Labs (all labs ordered are listed, but only abnormal results are displayed) Labs Reviewed  COMPREHENSIVE METABOLIC PANEL - Abnormal; Notable for the following components:      Result Value   Glucose, Bld 119 (*)    All other components within normal limits  CBC WITH DIFFERENTIAL/PLATELET  URINALYSIS, ROUTINE W REFLEX MICROSCOPIC  LEVETIRACETAM LEVEL    EKG EKG Interpretation  Date/Time:  Wednesday December 23 2021 12:21:32 EST Ventricular Rate:  69 PR Interval:  164 QRS Duration: 101 QT Interval:  376 QTC Calculation: 403 R Axis:   124 Text Interpretation: Sinus rhythm Right ventricular hypertrophy ST elevation suggests acute pericarditis  No significant change since last tracing Confirmed by Wandra Arthurs [id number]) on 12/23/2021 3:04:50 PM  Radiology CT Hip Left Wo Contrast  Result Date: 12/23/2021 CLINICAL DATA:  Hip trauma, fracture suspected. Patient fell this a.m. at [facility] room. Left hip pain. Unable to bear weight. Yes Roberto trauma severely good Christian newborn sciatica EXAM: CT OF THE LEFT HIP WITHOUT CONTRAST TECHNIQUE: Multidetector CT imaging of the left hip was performed according to the standard protocol. Multiplanar CT image reconstructions were also generated. RADIATION DOSE REDUCTION: This exam was performed according to the departmental dose-optimization program which includes automated exposure control, adjustment of the mA and/or kV according to patient size and/or use of  iterative reconstruction technique. COMPARISON:  None. FINDINGS: Bones/Joint/Cartilage No fracture or dislocation. Normal alignment. No joint effusion. Minimal femoral head and neck junction and acetabular lip osteophytes Ligaments Ligaments are suboptimally evaluated by CT. Muscles and Tendons Muscles are normal in bulk and density.  Tendons are unremarkable. Soft tissue No fluid collection or hematoma.  No soft tissue mass. IMPRESSION: No evidence of fracture or dislocation. No significant soft tissue abnormality. Mild hip osteoarthritis. Electronically Signed   By: Keane Police D.O.   On: 12/23/2021 15:08   DG Hip Unilat W or Wo Pelvis 2-3 Views Left  Result Date: 12/23/2021 CLINICAL DATA:  Possible seizure.  Left hip pain EXAM: DG HIP (WITH OR WITHOUT PELVIS) 2-3V LEFT COMPARISON:  None. FINDINGS: There is no evidence of hip fracture or dislocation. There is no evidence of arthropathy or other focal bone abnormality. IMPRESSION: Negative. Electronically Signed   By: Franchot Gallo M.D.   On: 12/23/2021 13:22    Procedures Procedures    Medications Ordered in ED Medications  levETIRAcetam (KEPPRA) IVPB 1000 mg/100 mL premix (0 mg Intravenous Stopped 12/23/21 1458)  fentaNYL (SUBLIMAZE) injection 100 mcg (100 mcg Intravenous Given 12/23/21 1457)    ED Course/ Medical Decision Making/ A&P                           Medical Decision Making DDx: -Seizure disorder -Meningitis -Trauma -ICH -Electrolyte abnormality -Metabolic derangement -Stroke -Toxin induced seizures -Medication side effects -Hypoxia -Hypoglycemia  Pt comes in w/ cc of seizure. Hx of seizure, and it seems clear that patient likely has well-controlled seizure overall.  Increased stress recently could be contributing.  Given IV Keppra here, but will not adjust his medication.  His neurology team made aware about the visit.  From trauma perspective, x-ray of the hip is normal.  Patient unable to bear weight, therefore CT  scan of his hip has been ordered.  If the CT is negative, then patient can be discharged with Ortho follow-up and crutches.  Problems Addressed: Hip pain, acute, right: undiagnosed new problem with uncertain prognosis Seizure [facility]): chronic illness or injury with exacerbation, progression, or side effects of treatment  Amount and/or Complexity of Data Reviewed External Data Reviewed: labs and notes.    Details: Reviewed prior neurology visit, ER visit notes Labs: ordered. Decision-making details documented in ED Course.    Details: CBC, metabolic profile are both reassuring Radiology: ordered.  Risk Prescription drug management.    Final Clinical Impression(s) / ED Diagnoses Final diagnoses:  Seizure ([place])  Hip pain, acute, right    Rx / DC Orders ED Discharge Orders     None         Varney Biles, MD 12/23/21 1527

## 2021-12-23 NOTE — ED Notes (Signed)
Pt aware urine sample needed. Urinal at bedside.

## 2021-12-23 NOTE — ED Triage Notes (Signed)
Patient reports a possible seizure this morning, states he was in the bed alone, might have woken up on the floor. Reports breathing hard and a cut on his tongue, making him think he had a seizure. Reports compliance w/ seizure medications.

## 2021-12-23 NOTE — Discharge Instructions (Addendum)
Please continue take your medications as prescribed.  The CT scan of your hip and x-ray of your hip both did not reveal any evidence of fracture. We suspect that you are having a deep bruise/contusion of the bone or soft tissue crush injury that is causing the discomfort.  Follow RICE treatment as recommended. Take ibuprofen 600 mg every 8 hours for the next 3 to 5 days.

## 2021-12-24 LAB — LEVETIRACETAM LEVEL: Levetiracetam Lvl: 37.9 ug/mL (ref 10.0–40.0)

## 2022-03-22 ENCOUNTER — Encounter: Payer: Self-pay | Admitting: Family Medicine

## 2022-03-22 ENCOUNTER — Ambulatory Visit: Payer: Self-pay | Admitting: Family Medicine
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
## 2022-03-22 NOTE — Patient Instructions (Incomplete)
Below is our plan: ° °We will *** ° °Please make sure you are consistent with timing of seizure medication. I recommend annual visit with primary care provider (PCP) for complete physical and routine blood work. We will monitor vitamin D level. I recommend daily intake of vitamin D (400-800iu) and calcium (800-1000mg) for bone health. Discuss Dexa screening with PCP.  ° °According to [place] law, you can not drive unless you are seizure / syncope free for at least 6 months and under physician's care. ° °Please maintain precautions. Do not participate in activities where a loss of awareness could harm you or someone else. No swimming alone, no tub bathing, no hot tubs, no driving, no operating motorized vehicles (cars, ATVs, motocycles, etc), lawnmowers, power tools or firearms. No standing at heights, such as rooftops, ladders or stairs. Avoid hot objects such as stoves, heaters, open fires. Wear a helmet when riding a bicycle, scooter, skateboard, etc. and avoid areas of traffic. Set your water heater to 120 degrees or less.  ° ° °Please make sure you are staying well hydrated. I recommend 50-60 ounces daily. Well balanced diet and regular exercise encouraged. Consistent sleep schedule with 6-8 hours recommended.  ° °Please continue follow up with care team as directed.  ° °Follow up with *** in *** ° °You may receive a survey regarding today's visit. I encourage you to leave honest feed back as I do use this information to improve patient care. Thank you for seeing me today!  ° ° °

## 2022-04-14 ENCOUNTER — Other Ambulatory Visit: Payer: Self-pay | Admitting: Diagnostic Neuroimaging

## 2022-04-14 DIAGNOSIS — G40909 Epilepsy, unspecified, not intractable, without status epilepticus: Secondary | ICD-10-CM

## 2022-04-17 ENCOUNTER — Telehealth: Payer: Self-pay | Admitting: Diagnostic Neuroimaging

## 2022-04-17 DIAGNOSIS — G40909 Epilepsy, unspecified, not intractable, without status epilepticus: Secondary | ICD-10-CM

## 2022-04-17 MED ORDER — LACOSAMIDE 100 MG PO TABS: 1.0000 | ORAL_TABLET | Freq: Two times a day (BID) | ORAL | 5 refills | Status: DC

## 2022-04-17 NOTE — Telephone Encounter (Signed)
Meds ordered this encounter  Medications   Lacosamide 100 MG TABS    Sig: Take 1 tablet (100 mg total) by mouth 2 (two) times daily.    Dispense:  60 tablet    Refill:  [place], MD [id number], 0000000 PM Certified in Neurology, Neurophysiology and [facility] [address], [place], [place] [address] [phone]

## 2022-07-01 ENCOUNTER — Other Ambulatory Visit: Payer: Self-pay | Admitting: Diagnostic Neuroimaging

## 2022-07-05 ENCOUNTER — Other Ambulatory Visit (INDEPENDENT_AMBULATORY_CARE_PROVIDER_SITE_OTHER): Payer: Self-pay | Admitting: Family

## 2022-07-05 DIAGNOSIS — I10 Essential (primary) hypertension: Secondary | ICD-10-CM

## 2022-07-06 NOTE — Telephone Encounter (Signed)
Patient called, left VM to return the call to the office to scheduled an appt for medication refill request.   

## 2022-07-06 NOTE — Telephone Encounter (Signed)
Requested medication (s) are due for refill today: yes  Requested medication (s) are on the active medication list: yes  Last refill:  07/02/21 #90/3  Future visit scheduled: no  Notes to clinic:  pt is overdue for appt and updated labs. Called, LVMTCB     Requested Prescriptions  Pending Prescriptions Disp Refills   lisinopril (ZESTRIL) 10 MG tablet [Pharmacy Med Name: LISINOPRIL 10 MG TABLET] 90 tablet 3    Sig: TAKE 1 TABLET BY MOUTH EVERY DAY     Cardiovascular:  ACE Inhibitors Failed - 07/05/2022  6:44 PM      Failed - Cr in normal range and within 180 days    Creatinine, Ser  Date Value Ref Range Status  12/23/2021 1.03 0.61 - 1.24 mg/dL Final         Failed - K in normal range and within 180 days    Potassium  Date Value Ref Range Status  12/23/2021 3.8 3.5 - 5.1 mmol/L Final         Failed - Last BP in normal range    BP Readings from Last 1 Encounters:  12/23/21 (!) 141/80         Failed - Valid encounter within last 6 months    Recent Outpatient Visits           10 months ago Essential hypertension   [facility] Grayce Sessions, NP   1 year ago Essential hypertension   [facility] Eleonore Chiquito, FNP   1 year ago Seizure [facility])   [facility] Grayce Sessions, NP              Passed - Patient is not pregnant

## 2022-10-12 ENCOUNTER — Other Ambulatory Visit: Payer: Self-pay | Admitting: Diagnostic Neuroimaging

## 2022-10-12 DIAGNOSIS — G40909 Epilepsy, unspecified, not intractable, without status epilepticus: Secondary | ICD-10-CM

## 2022-10-12 MED ORDER — LACOSAMIDE 100 MG PO TABS: 1.0000 | ORAL_TABLET | Freq: Two times a day (BID) | ORAL | 0 refills | Status: DC

## 2022-10-12 NOTE — Telephone Encounter (Addendum)
LVM and sent mychart message asking pt to call back and schedule f/u appointment. Cannot send medication refill before seeing pt in office per message from pod 3, please inform them if pt calls back and schedules.

## 2022-10-12 NOTE — Addendum Note (Signed)
Addended by: Huston Foley on: 10/12/2022 09:20 AM   Modules accepted: Orders

## 2022-10-12 NOTE — Telephone Encounter (Signed)
See phone note dated 11/21.

## 2022-10-12 NOTE — Telephone Encounter (Signed)
Patient is overdue for an appointment. Our office has called him to schedule this.  Patient is due for a refill on lacosamide.  [place] Controlled Substance Registry checked and is appropriate.

## 2022-10-12 NOTE — Addendum Note (Signed)
Addended by: Geronimo Running A on: 10/12/2022 09:13 AM   Modules accepted: Orders

## 2022-10-12 NOTE — Telephone Encounter (Signed)
Patient has not been seen at [facility] in nearly 18 months.  Dr. Marjory Lies, please review, may be worth sending a letter in the mail for patient to get in touch with [place] to make an appointment, will renew for 1 month, no refills.

## 2022-10-12 NOTE — Telephone Encounter (Signed)
Please call patient and advise him that he is overdue for a follow up appointment with our office. We need to schedule an appointment with him before we can refill his medications. Please let POD 3 know when this is completed.

## 2022-10-12 NOTE — Telephone Encounter (Signed)
I sent patient a letter asking him to call the office to schedule an appointment.

## 2022-11-02 ENCOUNTER — Encounter: Payer: Self-pay | Admitting: Diagnostic Neuroimaging

## 2022-11-02 ENCOUNTER — Ambulatory Visit (INDEPENDENT_AMBULATORY_CARE_PROVIDER_SITE_OTHER): Payer: BC Managed Care – PPO | Admitting: Diagnostic Neuroimaging

## 2022-11-02 VITALS — BP 139/88 | HR 73 | Ht 74.0 in | Wt 155.0 lb

## 2022-11-02 DIAGNOSIS — R569 Unspecified convulsions: Secondary | ICD-10-CM | POA: Diagnosis not present

## 2022-11-02 DIAGNOSIS — G40909 Epilepsy, unspecified, not intractable, without status epilepticus: Secondary | ICD-10-CM

## 2022-11-02 MED ORDER — LEVETIRACETAM 750 MG PO TABS: 1500.0000 mg | ORAL_TABLET | Freq: Two times a day (BID) | ORAL | 4 refills | Status: DC

## 2022-11-02 MED ORDER — LACOSAMIDE 100 MG PO TABS: 1.0000 | ORAL_TABLET | Freq: Two times a day (BID) | ORAL | 5 refills | Status: DC
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
## 2022-11-03 ENCOUNTER — Telehealth: Payer: Self-pay | Admitting: Neurology

## 2022-11-03 NOTE — Telephone Encounter (Signed)
DMV forms completed for the patient and given to medical records to fax for the pt to [facility]

## 2022-11-08 IMAGING — CT CT HIP*L* W/O CM
2 of 3 series · 17 of 46 positions shown, 19 images · non-contrast
Comparison: None.

CLINICAL DATA: Hip trauma, fracture suspected. Patient fell this
a.m. at motel room. Left hip pain. Unable to bear weight. Yes
Mafe trauma severely Shadesh Barney sciatica

EXAM:
CT OF THE LEFT HIP WITHOUT CONTRAST
TECHNIQUE: Multidetector CT imaging of the left hip was performed according to
the standard protocol. Multiplanar CT image reconstructions were
also generated.
RADIATION DOSE REDUCTION: This exam was performed according to the
departmental dose-optimization program which includes automated
exposure control, adjustment of the mA and/or kV according to
patient size and/or use of iterative reconstruction technique.

[Series 4: axial st · axial · 0.34mm/px · z∈[+1116,+1256]mm · 14 of 82 slices shown, 16 images]
[im 6/82  soft-tissue]
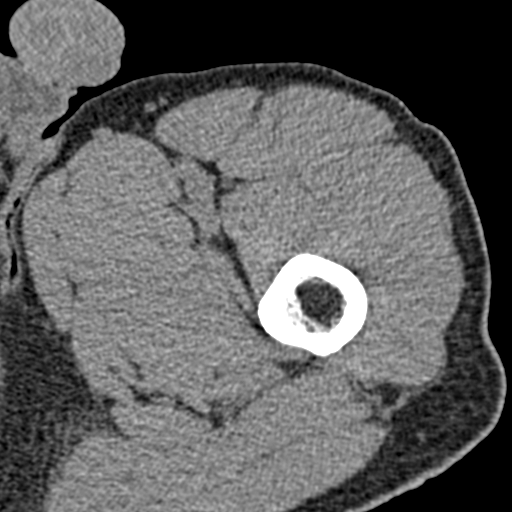
[im 6/82  bone]
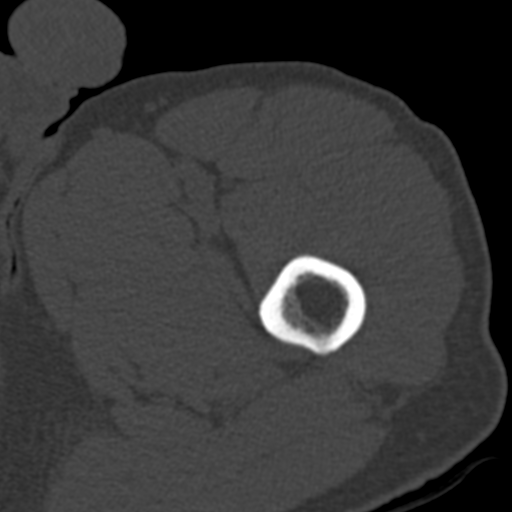
[im 11/82  soft-tissue]
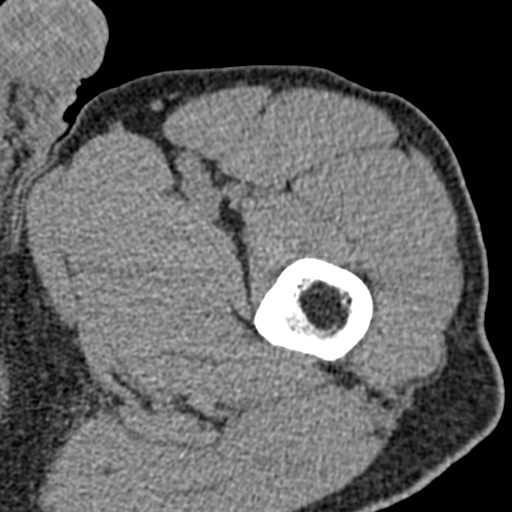
[im 16/82  soft-tissue]
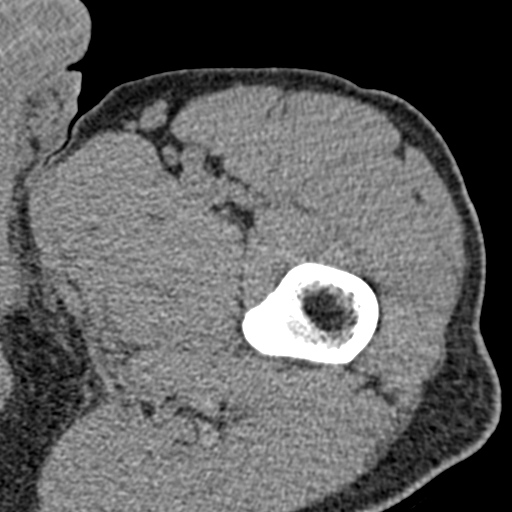
[im 21/82  soft-tissue]
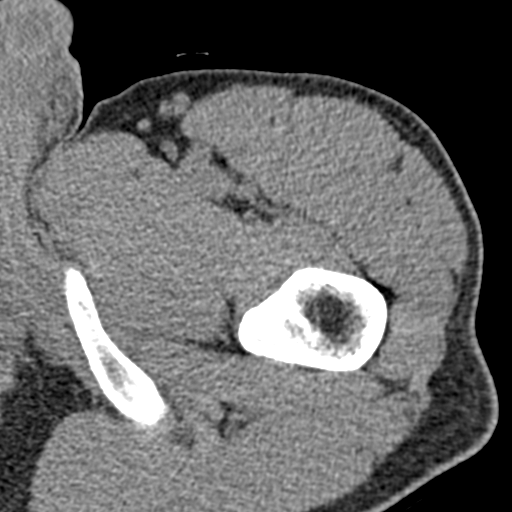
[im 27/82  soft-tissue]
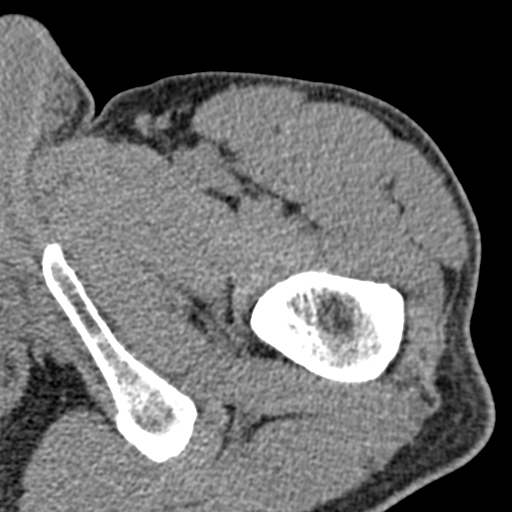
[im 32/82  soft-tissue]
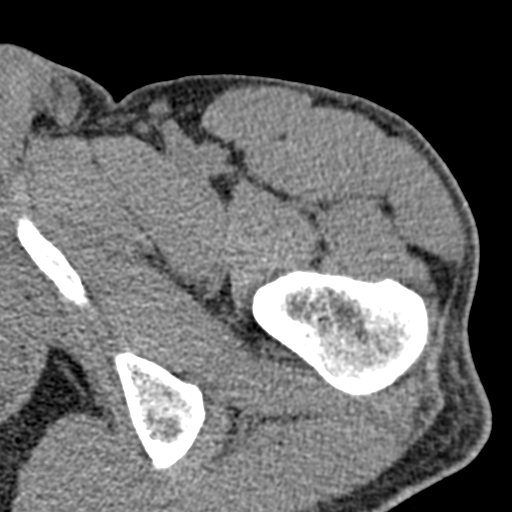
[im 37/82  soft-tissue]
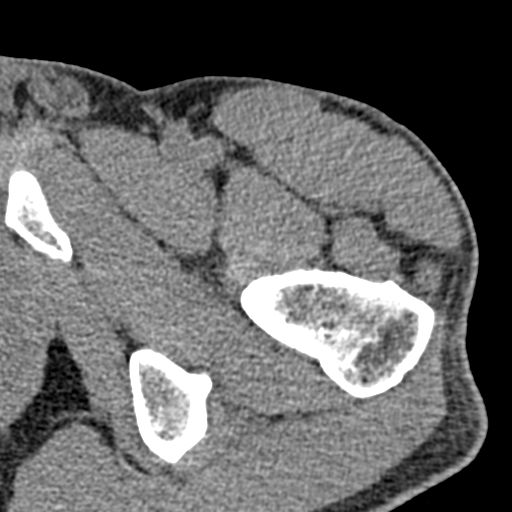
[im 45/82  soft-tissue]
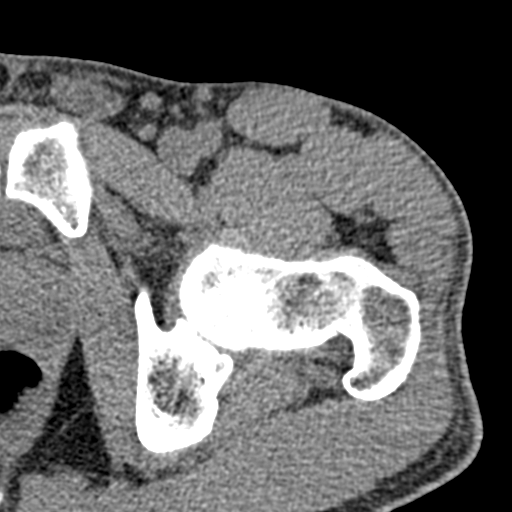
[im 50/82  soft-tissue]
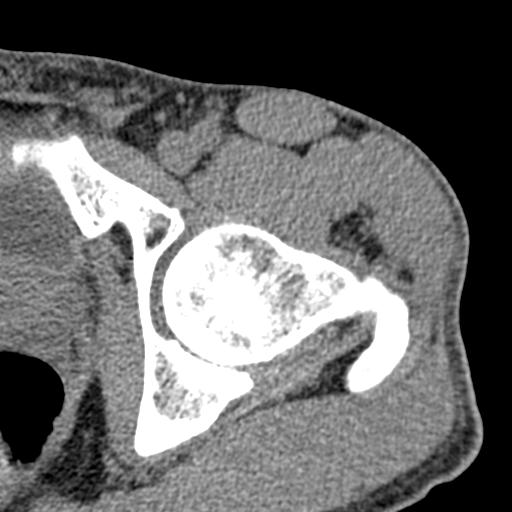
[im 50/82  bone]
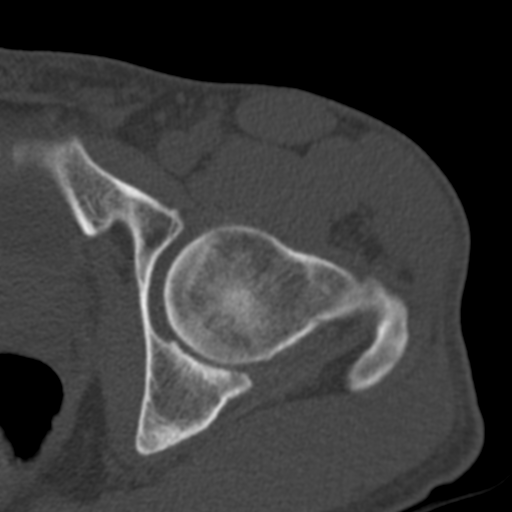
[im 55/82  soft-tissue]
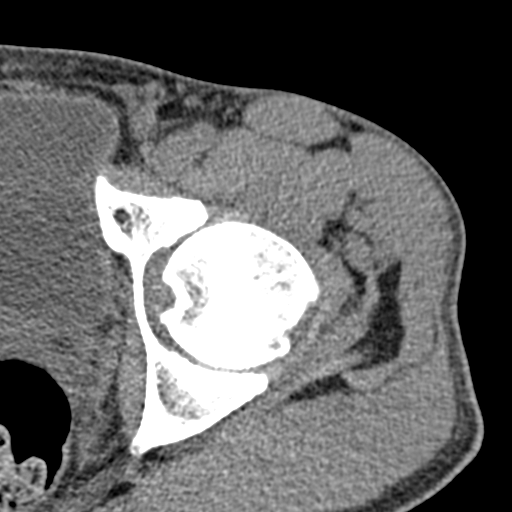
[im 61/82  soft-tissue]
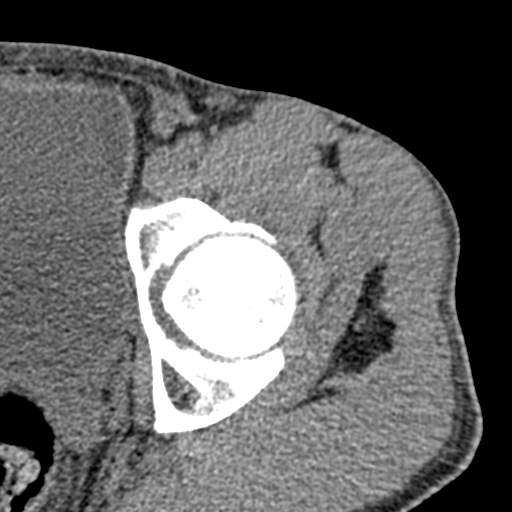
[im 66/82  soft-tissue]
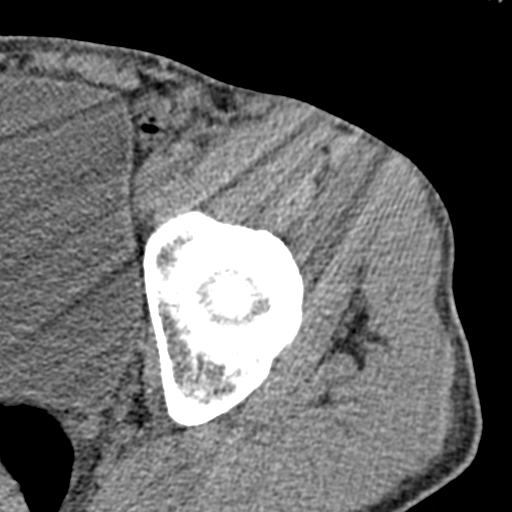
[im 71/82  soft-tissue]
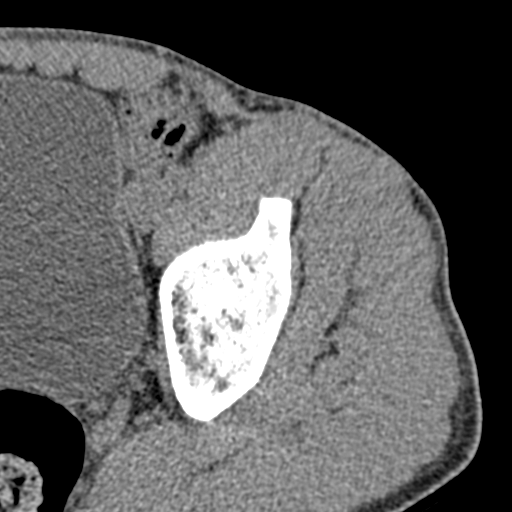
[im 76/82  soft-tissue]
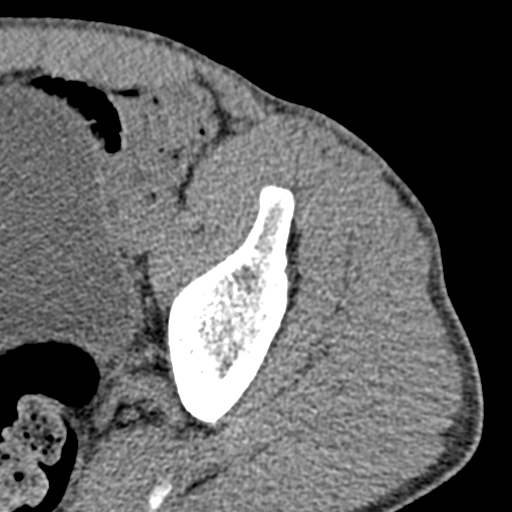

[Series 9: coronal st · coronal · 0.32mm/px · 3 of 89 slices shown]
[im 30/89  soft-tissue]
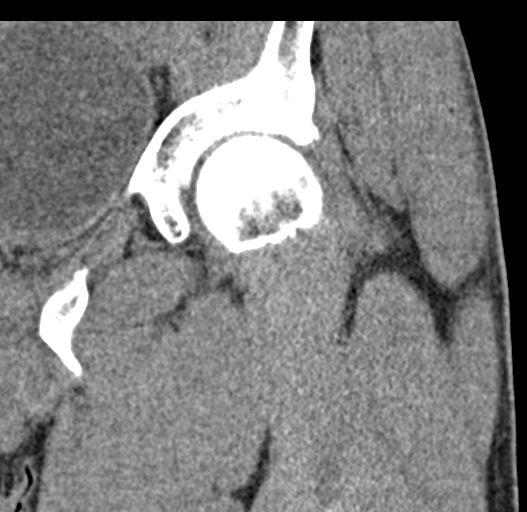
[im 40/89  soft-tissue]
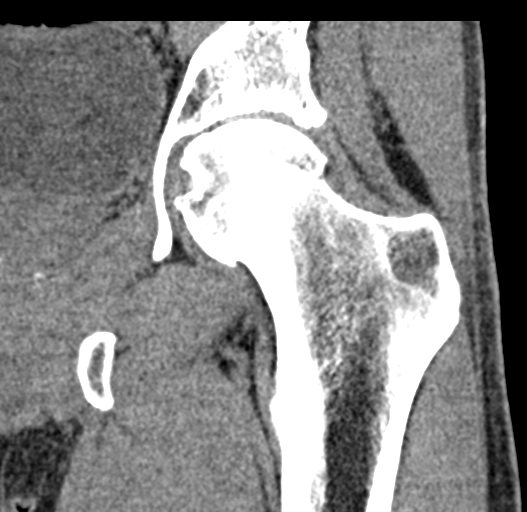
[im 49/89  soft-tissue]
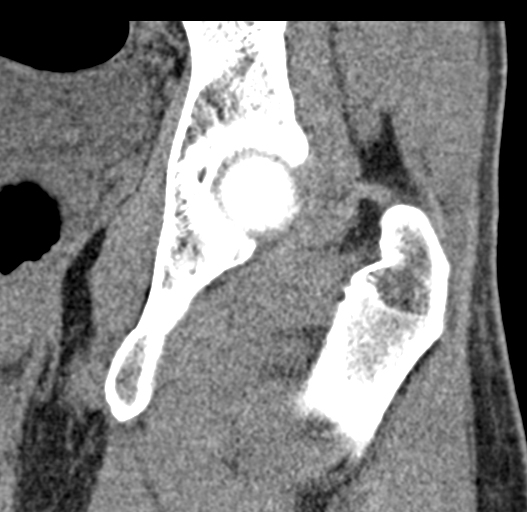

[17 of 46 positions shown; findings below may reference images not displayed]

FINDINGS: Bones/Joint/Cartilage

No fracture or dislocation. Normal alignment. No joint effusion.
Minimal femoral head and neck junction and acetabular lip
osteophytes

Ligaments

Ligaments are suboptimally evaluated by CT.

Muscles and Tendons
Muscles are normal in bulk and density.  Tendons are unremarkable.

Soft tissue
No fluid collection or hematoma.  No soft tissue mass.
IMPRESSION: No evidence of fracture or dislocation. No significant soft tissue
abnormality. Mild hip osteoarthritis.

## 2023-04-16 ENCOUNTER — Emergency Department (HOSPITAL_COMMUNITY)
Admission: EM | Admit: 2023-04-16 | Discharge: 2023-04-16 | Disposition: A | Discharge status: Home / Self Care | Payer: BC Managed Care – PPO | Attending: Emergency Medicine | Admitting: Emergency Medicine

## 2023-04-16 ENCOUNTER — Emergency Department (HOSPITAL_COMMUNITY): Payer: BC Managed Care – PPO

## 2023-04-16 ENCOUNTER — Encounter (HOSPITAL_COMMUNITY): Payer: Self-pay | Admitting: Emergency Medicine

## 2023-04-16 ENCOUNTER — Other Ambulatory Visit: Payer: Self-pay

## 2023-04-16 DIAGNOSIS — W208XXA Other cause of strike by thrown, projected or falling object, initial encounter: Secondary | ICD-10-CM | POA: Diagnosis not present

## 2023-04-16 DIAGNOSIS — S99921A Unspecified injury of right foot, initial encounter: Secondary | ICD-10-CM | POA: Diagnosis present

## 2023-04-16 DIAGNOSIS — S9031XA Contusion of right foot, initial encounter: Secondary | ICD-10-CM | POA: Diagnosis not present

## 2023-04-16 MED ORDER — IBUPROFEN 400 MG PO TABS
400.0000 mg | ORAL_TABLET | Freq: Once | ORAL | Status: AC | PRN
  Administered 2023-04-16: 400 mg via ORAL
  Filled 2023-04-16: qty 1

## 2023-04-16 NOTE — ED Notes (Signed)
Discharge instructions discussed with pt. Verbalized understanding. VSS. No questions or concerns regarding discharge  

## 2023-04-16 NOTE — Discharge Instructions (Signed)
Apply ice for 30 minutes at a time, 4 times a day.  Keep your foot elevated is much as possible.  Use crutches until you are able to bear weight comfortably.  You may take either ibuprofen or acetaminophen as needed for pain.  If you need additional pain relief, you may take both ibuprofen and acetaminophen.  The combination of ibuprofen and acetaminophen gives you better pain relief and you get from taking either medication by itself.

## 2023-04-16 NOTE — ED Triage Notes (Signed)
Pt c/o right foot pain after dropping packages on foot on Thursday morning. Pt ambulatory in triage.

## 2023-04-16 NOTE — ED Provider Notes (Signed)
[facility] EMERGENCY DEPARTMENT AT [facility] Provider Note   CSN: [id number] Arrival date & time: 04/16/23  0151     History  Chief Complaint  Patient presents with   Foot Pain    R    Reginald Ray is a 47 y.o. male.  The history is provided by the patient.  Foot Pain  He has history of seizure disorder and comes in having dropped a package on his right foot 2 days ago.  He is complaining of pain in the dorsal and lateral aspect of the right foot and has noted severe pain with ambulation.  He did take a dose of acetaminophen the day it happened, but has not been taking anything since then.  He denies other injury.   Home Medications Prior to Admission medications   Medication Sig Start Date End Date Taking? Authorizing Provider  acetaminophen (TYLENOL) 500 MG tablet Take 1,000 mg by mouth every 6 (six) hours as needed for mild pain.    [provider]  Lacosamide 100 MG TABS Take 1 tablet (100 mg total) by mouth 2 (two) times daily. 11/02/22   Penumalli, Glenford Bayley, MD  levETIRAcetam (KEPPRA) 750 MG tablet Take 2 tablets (1,500 mg total) by mouth 2 (two) times daily. 11/02/22   Penumalli, Glenford Bayley, MD  lisinopril (ZESTRIL) 10 MG tablet Take 1 tablet (10 mg total) by mouth daily. Patient not taking: Reported on 11/02/2022 07/02/21   Eleonore Chiquito, FNP      Allergies    Patient has no known allergies.    Review of Systems   Review of Systems  All other systems reviewed and are negative.   Physical Exam Updated Vital Signs BP (!) 139/97   Pulse 75   Temp 98.6 F (37 C) (Oral)   Resp 16   Ht 6\' 2"  (1.88 m)   Wt 70.3 kg   SpO2 100%   BMI 19.90 kg/m  Physical Exam Vitals and nursing note reviewed.   47 year old male, resting comfortably and in no acute distress. Vital signs are significant for mildly elevated blood pressure. Oxygen saturation is 100%, which is normal. Head is normocephalic and atraumatic. PERRLA, EOMI.  Lungs are clear  without rales, wheezes, or rhonchi. Chest is nontender. Heart has regular rate and rhythm without murmur. Abdomen is soft, flat, nontender. Extremities: There is mild soft tissue swelling over the dorsal and lateral aspect of the right midfoot with tenderness to palpation over that area.  Distal neurovascular exam is intact with prompt capillary refill and normal sensation.  Remainder of extremity exam is normal. Skin is warm and dry without rash. Neurologic: Mental status is normal, cranial nerves are intact, moves all extremities equally.  ED Results / Procedures / Treatments    Radiology DG Foot Complete Right  Result Date: 04/16/2023 CLINICAL DATA:  Status post trauma. EXAM: RIGHT FOOT COMPLETE - 3+ VIEW COMPARISON:  None Available. FINDINGS: There is no evidence of fracture or dislocation. There is no evidence of arthropathy or other focal bone abnormality. Mild dorsal soft tissue swelling is noted. IMPRESSION: Mild dorsal soft tissue swelling without evidence of acute fracture or dislocation. Electronically Signed   By: Aram Candela M.D.   On: 04/16/2023 02:26    Procedures Procedures    Medications Ordered in ED Medications  ibuprofen (ADVIL) tablet 400 mg (400 mg Oral Given 04/16/23 0210)    ED Course/ Medical Decision Making/ A&P  Medical Decision Making Amount and/or Complexity of Data Reviewed Radiology: ordered.  Risk Prescription drug management.   Contusion of the right foot, rule out fracture.  X-rays were obtained and showed no evidence of fracture.  Have independently viewed the images, and agree with the radiologist's interpretation.  I have observed the patient ambulate and he is not able to put weight on his right foot.  I have ordered crutches for him to use as needed and I have instructed him on ice and elevation and told to use over-the-counter NSAIDs and acetaminophen as needed for pain.  Final Clinical Impression(s) / ED  Diagnoses Final diagnoses:  Contusion of right foot, initial encounter    Rx / DC Orders ED Discharge Orders     None         Dione Booze, MD 04/16/23 [phone]

## 2023-05-11 ENCOUNTER — Other Ambulatory Visit: Payer: Self-pay | Admitting: Diagnostic Neuroimaging

## 2023-05-11 DIAGNOSIS — G40909 Epilepsy, unspecified, not intractable, without status epilepticus: Secondary | ICD-10-CM

## 2023-08-02 ENCOUNTER — Encounter: Payer: Self-pay | Admitting: Adult Health

## 2023-08-02 ENCOUNTER — Ambulatory Visit (INDEPENDENT_AMBULATORY_CARE_PROVIDER_SITE_OTHER): Payer: BC Managed Care – PPO | Admitting: Adult Health

## 2023-08-02 VITALS — BP 139/85 | HR 75 | Ht 74.0 in | Wt 147.0 lb

## 2023-08-02 DIAGNOSIS — G40909 Epilepsy, unspecified, not intractable, without status epilepticus: Secondary | ICD-10-CM | POA: Diagnosis not present
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
## 2023-08-02 NOTE — Patient Instructions (Signed)
Continue levetiracetam and lacosamide at current dosages  Please call or send MyChart message with ANY seizure activity    Follow up in 1 year or call earlier if needed

## 2023-08-04 ENCOUNTER — Telehealth: Payer: Self-pay | Admitting: Adult Health

## 2023-08-04 NOTE — Telephone Encounter (Signed)
Pt came into office states he is trying to get assistance for getting his electric system at home getting turned back on. States he is needing a letter, stating once he has a seizure episode that he would be unable to work and drive.  Would like a call if there is an issue pt is needing this letter by 08/11/2023 and would like to pick it up from the office if he is able to get this.

## 2023-08-10 NOTE — Telephone Encounter (Signed)
Called the pt back. He states that back several years ago the electricity bill had built up to $800. States that he is working on getting some financial assistance in paying that off and states that they were requesting something showing that he has a medical condition of seizures. Pt states that he is working and has not had any recent seizures but the letter was more to describe that he has seizure medical condition and has had it since 2019.  At this time the patient doesn't need the letter. He thinks his office notes with Shanda Bumps will be sufficient. Pt verbalized understanding. Pt had no questions at this time but was encouraged to call back if questions arise.

## 2023-11-07 ENCOUNTER — Other Ambulatory Visit: Payer: Self-pay | Admitting: Diagnostic Neuroimaging

## 2023-11-07 DIAGNOSIS — G40909 Epilepsy, unspecified, not intractable, without status epilepticus: Secondary | ICD-10-CM

## 2023-11-08 NOTE — Telephone Encounter (Signed)
Pt checking status of refill, states pharmacy doesn't have information needed

## 2023-11-08 NOTE — Telephone Encounter (Signed)
Pt called wanting to know when this will be filled for him. Please advise.

## 2024-02-09 ENCOUNTER — Telehealth: Payer: Self-pay | Admitting: Adult Health

## 2024-02-09 NOTE — Telephone Encounter (Signed)
 Pt dropped off forms to be filled out and faxed to [facility].

## 2024-02-15 ENCOUNTER — Telehealth: Payer: Self-pay | Admitting: *Deleted

## 2024-02-15 NOTE — Telephone Encounter (Signed)
 DMV forms completed and given to medical records.

## 2024-02-15 NOTE — Telephone Encounter (Signed)
 Pt dmv form faxed on 02/15/2024

## 2024-03-02 ENCOUNTER — Telehealth: Payer: Self-pay | Admitting: *Deleted

## 2024-03-02 NOTE — Telephone Encounter (Signed)
 Not able to reach patient. I re faxed pt dmv form on 03/02/2024

## 2024-03-21 NOTE — Telephone Encounter (Signed)
 Pt called to inquire about DMV form, message from MR specialist was relayed.

## 2024-05-05 ENCOUNTER — Other Ambulatory Visit: Payer: Self-pay | Admitting: Diagnostic Neuroimaging

## 2024-05-05 DIAGNOSIS — G40909 Epilepsy, unspecified, not intractable, without status epilepticus: Secondary | ICD-10-CM

## 2024-05-06 ENCOUNTER — Telehealth: Payer: Self-pay | Admitting: Neurology

## 2024-05-06 ENCOUNTER — Other Ambulatory Visit: Payer: Self-pay | Admitting: Neurology

## 2024-05-06 DIAGNOSIS — G40909 Epilepsy, unspecified, not intractable, without status epilepticus: Secondary | ICD-10-CM

## 2024-05-06 MED ORDER — LACOSAMIDE 100 MG PO TABS: 1.0000 | ORAL_TABLET | Freq: Two times a day (BID) | ORAL | 5 refills | Status: DC

## 2024-05-06 NOTE — Telephone Encounter (Signed)
 Patient called for refill on seizure medication, refilled by on call physician

## 2024-10-29 ENCOUNTER — Other Ambulatory Visit: Payer: Self-pay

## 2024-10-29 DIAGNOSIS — G40909 Epilepsy, unspecified, not intractable, without status epilepticus: Secondary | ICD-10-CM

## 2024-10-29 MED ORDER — LACOSAMIDE 100 MG PO TABS: 1.0000 | ORAL_TABLET | Freq: Two times a day (BID) | ORAL | 1 refills | Status: DC

## 2024-10-29 NOTE — Telephone Encounter (Signed)
 Last refilled by patient on : 09/30/24 Last office visit : 19/10/24 Next office visit : was due back 08/01/24 (mychart message sent to make an appoitment for further refills) Per last note - continue lacosamide  100mg  twice a day   Please advise on courtesy refill for  previous Dr. Ines patient

## 2024-11-02 ENCOUNTER — Other Ambulatory Visit: Payer: Self-pay | Admitting: Neurology

## 2024-11-02 DIAGNOSIS — G40909 Epilepsy, unspecified, not intractable, without status epilepticus: Secondary | ICD-10-CM

## 2024-11-02 MED ORDER — LACOSAMIDE 100 MG PO TABS: 1.0000 | ORAL_TABLET | Freq: Two times a day (BID) | ORAL | 1 refills | Status: AC

## 2024-11-02 MED ORDER — LEVETIRACETAM 750 MG PO TABS: 1500.0000 mg | ORAL_TABLET | Freq: Two times a day (BID) | ORAL | 4 refills | Status: AC

## 2024-11-02 NOTE — Telephone Encounter (Signed)
 Refills for lacosamide  and Keppra  sent in
